# Patient Record
Sex: Female | Born: 1950 | Race: Black or African American | Hispanic: No | Marital: Married | Smoking: Never smoker
Health system: Southern US, Community
[De-identification: ages and names within clinical notes are randomized; demographics above are authoritative.]

## PROBLEM LIST (undated history)

## (undated) DIAGNOSIS — M1990 Unspecified osteoarthritis, unspecified site: Secondary | ICD-10-CM

## (undated) DIAGNOSIS — J189 Pneumonia, unspecified organism: Secondary | ICD-10-CM

## (undated) DIAGNOSIS — C801 Malignant (primary) neoplasm, unspecified: Secondary | ICD-10-CM

## (undated) DIAGNOSIS — I10 Essential (primary) hypertension: Secondary | ICD-10-CM

## (undated) DIAGNOSIS — K219 Gastro-esophageal reflux disease without esophagitis: Secondary | ICD-10-CM

## (undated) DIAGNOSIS — E7800 Pure hypercholesterolemia, unspecified: Secondary | ICD-10-CM

## (undated) DIAGNOSIS — F32A Depression, unspecified: Secondary | ICD-10-CM

## (undated) DIAGNOSIS — R51 Headache: Secondary | ICD-10-CM

## (undated) DIAGNOSIS — M069 Rheumatoid arthritis, unspecified: Secondary | ICD-10-CM

## (undated) DIAGNOSIS — Z87442 Personal history of urinary calculi: Secondary | ICD-10-CM

## (undated) DIAGNOSIS — F329 Major depressive disorder, single episode, unspecified: Secondary | ICD-10-CM

## (undated) HISTORY — PX: KIDNEY STONE SURGERY: SHX686

## (undated) HISTORY — PX: CATARACT EXTRACTION: SUR2

## (undated) HISTORY — PX: ESOPHAGEAL DILATION: SHX303

---

## 2012-08-18 DIAGNOSIS — J189 Pneumonia, unspecified organism: Secondary | ICD-10-CM

## 2012-08-18 HISTORY — DX: Pneumonia, unspecified organism: J18.9

## 2012-12-07 ENCOUNTER — Ambulatory Visit (HOSPITAL_COMMUNITY)
Admission: RE | Admit: 2012-12-07 | Discharge: 2012-12-07 | Disposition: A | Discharge status: Home / Self Care | Payer: Medicare Other | Source: Ambulatory Visit | Attending: Urology | Admitting: Urology

## 2012-12-07 ENCOUNTER — Other Ambulatory Visit (HOSPITAL_COMMUNITY): Payer: Self-pay | Admitting: Urology

## 2012-12-07 DIAGNOSIS — D4959 Neoplasm of unspecified behavior of other genitourinary organ: Secondary | ICD-10-CM

## 2012-12-07 DIAGNOSIS — R918 Other nonspecific abnormal finding of lung field: Secondary | ICD-10-CM | POA: Insufficient documentation

## 2012-12-07 DIAGNOSIS — Z01818 Encounter for other preprocedural examination: Secondary | ICD-10-CM | POA: Insufficient documentation

## 2012-12-08 ENCOUNTER — Other Ambulatory Visit: Payer: Self-pay | Admitting: Urology

## 2012-12-08 NOTE — Progress Notes (Signed)
PT HAD CXR DONE 12/07/12 - REPORT IN EPIC - ABNORMAL.  CXR REPORT FAXED TO DR. Vevelyn Royals OFFICE FOR REVIEW.

## 2012-12-11 ENCOUNTER — Encounter (INDEPENDENT_AMBULATORY_CARE_PROVIDER_SITE_OTHER): Payer: Self-pay

## 2012-12-11 ENCOUNTER — Encounter (HOSPITAL_COMMUNITY)
Admission: RE | Admit: 2012-12-11 | Discharge: 2012-12-11 | Disposition: A | Discharge status: Home / Self Care | Payer: Medicare Other | Source: Ambulatory Visit | Attending: Urology | Admitting: Urology

## 2012-12-11 ENCOUNTER — Other Ambulatory Visit: Payer: Self-pay

## 2012-12-11 ENCOUNTER — Encounter (HOSPITAL_COMMUNITY): Payer: Self-pay

## 2012-12-11 ENCOUNTER — Encounter (HOSPITAL_COMMUNITY): Payer: Self-pay | Admitting: Pharmacy Technician

## 2012-12-11 DIAGNOSIS — Z01812 Encounter for preprocedural laboratory examination: Secondary | ICD-10-CM | POA: Insufficient documentation

## 2012-12-11 DIAGNOSIS — Z01810 Encounter for preprocedural cardiovascular examination: Secondary | ICD-10-CM | POA: Insufficient documentation

## 2012-12-11 DIAGNOSIS — Z01818 Encounter for other preprocedural examination: Secondary | ICD-10-CM | POA: Insufficient documentation

## 2012-12-11 HISTORY — DX: Rheumatoid arthritis, unspecified: M06.9

## 2012-12-11 HISTORY — DX: Pneumonia, unspecified organism: J18.9

## 2012-12-11 HISTORY — DX: Personal history of urinary calculi: Z87.442

## 2012-12-11 HISTORY — DX: Headache: R51

## 2012-12-11 HISTORY — DX: Gastro-esophageal reflux disease without esophagitis: K21.9

## 2012-12-11 HISTORY — DX: Unspecified osteoarthritis, unspecified site: M19.90

## 2012-12-11 HISTORY — DX: Depression, unspecified: F32.A

## 2012-12-11 HISTORY — DX: Major depressive disorder, single episode, unspecified: F32.9

## 2012-12-11 HISTORY — DX: Pure hypercholesterolemia, unspecified: E78.00

## 2012-12-11 HISTORY — DX: Essential (primary) hypertension: I10

## 2012-12-11 LAB — BASIC METABOLIC PANEL
Calcium: 9.9 mg/dL (ref 8.4–10.5)
Creatinine, Ser: 0.58 mg/dL (ref 0.50–1.10)
GFR calc non Af Amer: >90 mL/min (ref >90)
Glucose, Bld: 90 mg/dL (ref 70–99)
Sodium: 141 mEq/L (ref 135–145)

## 2012-12-11 LAB — CBC
MCH: 26.5 pg (ref 26.0–34.0)
MCHC: 31.5 g/dL (ref 30.0–36.0)
MCV: 83.9 fL (ref 78.0–100.0)
Platelets: 352 10*3/uL (ref 150–400)
RBC: 4.23 MIL/uL (ref 3.87–5.11)
RDW: 15 % (ref 11.5–15.5)
WBC: 10 10*3/uL (ref 4.0–10.5)

## 2012-12-11 LAB — ABO/RH: ABO/RH(D): O POS

## 2012-12-11 NOTE — Patient Instructions (Addendum)
20 Emily Cooper  12/11/2012   Your procedure is scheduled on: 12/18/12  Report to [facility] at 5:15 AM.  Call this number if you have problems the morning of surgery [phone]   Remember:   Do not eat food or drink liquids After Midnight.     Take these medicines the morning of surgery with A SIP OF WATER: albuterol, nexium, hydrocodone if needed, zoloft, pravastatin   Do not wear jewelry, make-up or nail polish.  Do not wear lotions, powders, or perfumes. You may wear deodorant.  Do not shave 48 hours prior to surgery. Men may shave face and neck.  Do not bring valuables to the hospital.  Contacts, dentures or bridgework may not be worn into surgery.  Leave suitcase in the car. After surgery it may be brought to your room.  For patients admitted to the hospital, checkout time is 11:00 AM the day of discharge.   Please read over the following fact sheets that you were given: blood fact sheet, incentive spirometry fact sheet Birdie Sons, RN  pre op nurse call if needed [phone]    FAILURE TO FOLLOW THESE INSTRUCTIONS MAY RESULT IN CANCELLATION OF YOUR SURGERY   Patient Signature: ___________________________________________

## 2012-12-12 NOTE — H&P (Signed)
Chief Complaint Left renal mass and renal stone   History of Present Illness Emily Cooper is a 62 year old female seen in consultation at the request of Dr. Wendall Stade for a left complex renal mass. She has a history of kidney stones s/p ESWL in the past. She presented in late October with left flank pain and hematuria and was found to have a 1 cm lower pole left renal stone which was thought to probably be causing intermittent obstruction. On her non-contrasted CT scan, she was noted to have a possible complex cystic mass of the lower pole of the left kidney. She underwent cystoscopy and stent placement in preparation for ESWL therapy. On further evaluation with ultrasound, this complex mass appeared to be worrisome and MR imaging was recommended. She did undergo an MRI of the abdomen with and without contrast on 11/29/12 which showed a 4.2 cm complex cystic mass off the anterior lower pole of the left kidney. This demonstrated multiple thin septae which appeared to be enhancing raising concern for a possible cystic renal cell carcinoma. 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 There was no evidence of regional lymphadenopathy, contralateral renal masses except for simple appearing cysts, and no evidence of other abdominal metastases. There was a single branching renal artery to the left kidney and a circumaortic renal vein. Her baseline renal function is normal. Serum Cr was 0.65.    She has not yet been treated for her stone pending further evaluation of her renal mass. She does continue to have some intermittent hematuria since her stent was placed and does have some frequency and urgency symptoms. She will occasionally have some flank pain although this is fairly minimal. She has no family history of kidney cancer.   Past Medical History Problems  1. History of depression (V11.8) 2. History of gastroesophageal reflux (GERD) (V12.79) 3. History of hyperlipidemia (V12.29) 4. History  of hypertension (V12.59) 5. History of rheumatoid arthritis (V13.4)  Surgical History Problems  1. History of Cataract Surgery  Current Meds 1. Ciprofloxacin HCl - 500 MG Oral Tablet;  Therapy: (Recorded:20Nov2014) to Recorded 2. Exforge 10-320 MG Oral Tablet (Amlodipine Besylate-Valsartan);  Therapy: (Recorded:20Nov2014) to Recorded 3. Fluticasone Propionate 50 MCG/ACT Nasal Suspension;  Therapy: (Recorded:20Nov2014) to Recorded 4. Folic Acid CAPS;  Therapy: (Recorded:20Nov2014) to Recorded 5. Hydrocodone-Acetaminophen 10-325 MG Oral Tablet;  Therapy: (Recorded:20Nov2014) to Recorded 6. Methotrexate 2.5 MG Oral Tablet;  Therapy: (Recorded:20Nov2014) to Recorded 7. NexIUM 40 MG Oral Capsule Delayed Release;  Therapy: (Recorded:20Nov2014) to Recorded 8. Pravastatin Sodium 80 MG Oral Tablet;  Therapy: (Recorded:20Nov2014) to Recorded 9. PredniSONE (Pak) 10 MG Oral Tablet;  Therapy: (Recorded:20Nov2014) to Recorded 10. Sertraline HCl - 100 MG Oral Tablet;   Therapy: (Recorded:20Nov2014) to Recorded 11. Tamsulosin HCl - 0.4 MG Oral Capsule;   Therapy: (Recorded:20Nov2014) to Recorded 12. Ventolin HFA 108 (90 Base) MCG/ACT Inhalation Aerosol Solution;   Therapy: (Recorded:20Nov2014) to Recorded  Allergies Medication  1. Naproxen TABS  Family History Problems  1. Family history of cardiac disorder (V17.49) : Father  Social History Problems    Denied: History of Alcohol use   Denied: History of Caffeine use   Former smoker Chemical engineer)   quit smoking 2004   Married  Review of Systems Genitourinary, constitutional, skin, eye, otolaryngeal, hematologic/lymphatic, cardiovascular, pulmonary, endocrine, musculoskeletal, gastrointestinal, neurological and psychiatric system(s) were reviewed and pertinent findings if present are noted.  Gastrointestinal: heartburn.  Eyes: blurred vision.  ENT: sinus problems.  Cardiovascular: no chest pain and no  leg swelling.  Endocrine:  polydipsia.  Musculoskeletal: back pain and joint pain.  Neurological: headache.  Psychiatric: depression and anxiety.    Vitals Vital Signs [Data Includes: Last 1 Day]  Recorded: 20Nov2014 11:18AM  Height: 5 ft 2 in Weight: 199 lb  BMI Calculated: 36.4 BSA Calculated: 1.91 Blood Pressure: 155 / 96 Temperature: 98.8 F Heart Rate: 103  Physical Exam Constitutional: Well nourished and well developed . No acute distress.  ENT:. The ears and nose are normal in appearance.  Neck: The appearance of the neck is normal and no neck mass is present.  Pulmonary: No respiratory distress, normal respiratory rhythm and effort and clear bilateral breath sounds.  Cardiovascular: Heart rate and rhythm are normal . No peripheral edema.  Abdomen: The abdomen is mildly obese. The abdomen is soft and nontender. No masses are palpated. No CVA tenderness. No hernias are palpable. No hepatosplenomegaly noted.  Lymphatics: The femoral and inguinal nodes are not enlarged or tender.  Skin: Normal skin turgor, no visible rash and no visible skin lesions.  Neuro/Psych:. Mood and affect are appropriate.    Results/Data Urine [Data Includes: Last 1 Day]   20Nov2014  COLOR RED   APPEARANCE TURBID   SPECIFIC GRAVITY 1.025   pH 8.0   GLUCOSE NEG mg/dL  BILIRUBIN NEG   KETONE NEG mg/dL  BLOOD LARGE   PROTEIN 100 mg/dL  UROBILINOGEN 0.2 mg/dL  NITRITE POS   LEUKOCYTE ESTERASE TRACE   SQUAMOUS EPITHELIAL/HPF RARE   WBC 0-2 WBC/hpf  RBC TNTC RBC/hpf  BACTERIA NONE SEEN   CRYSTALS NONE SEEN   CASTS NONE SEEN    I have independently reviewed her recent CT scan, renal ultrasound, and MRI. Findings are as dictated above.   Assessment Assessed  1. Nephrolithiasis (592.0) 2. 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 UA With REFLEX; Status:Complete;   Done: 20Nov2014 10:57AM Renal neoplasm  2. Follow-up Schedule Surgery Office  Follow-up  Status: Complete  Done: 20Nov2014 3. CHEST X-RAY;  Status:Hold For - Print,Records; Requested for:20Nov2014;  4. COMPREHENSIVE METABOLIC PANEL; Status:In Progress - Specimen/Data Collected;    Done: 20Nov2014 5. VENIPUNCTURE; Status:Complete;   Done: 20Nov2014  Discussion/Summary  1. Left complex renal cyst: I reviewed her MRI with her and her husband today. This does demonstrate a Bosniak 3 complex renal cyst. We discussed that this does carry a 50% chance of being a malignant lesion. We therefore discussed options for management. I did recommend she strongly consider surgical excision for a definitive diagnosis and for potentially curative treatment if this is found to be a malignant lesion. Considering the location of the mass, I have recommended that she consider a left robotic-assisted laparoscopic partial nephrectomy although she would be a potential risk for a total nephrectomy considering the proximity to the renal hilum. She will complete her metastatic evaluation including chest imaging and laboratory studies today.  She will require stress dose steroids perioperatively.1    The patient was provided information regarding their renal mass including the relative risk of benign versus malignant pathology and the natural history of renal cell carcinoma and other possible malignancies of the kidney. The role of renal biopsy, laboratory testing, and imaging studies to further characterize renal masses and/or the presence of metastatic disease were explained. We discussed the role of active surveillance, surgical therapy with both radical nephrectomy and nephron-sparing surgery, and ablative therapy in the treatment of renal masses. In addition, we discussed our goals of providing an accurate diagnosis and oncologic control  while maintaining optimal renal function as appropriate based on the size, location, and complexity of their renal mass as well as their co-morbidities.    We have discussed the risks of treatment in detail including but not limited  to bleeding, infection, heart attack, stroke, death, venothromoboembolism, cancer recurrence, injury/damage to surrounding organs and structures, urine leak, the possibility of open surgical conversion for patients undergoing minimally invasive surgery, the risk of developing chronic kidney disease and its associated implications, and the potential risk of end stage renal disease possibly necessitating dialysis.     2. Left renal calculus: She has a 1 cm stone located in the lower pole of the left kidney. It is possible that we may be able to perform a pyelolithotomy at the time of her upcoming partial nephrectomy. She understands that I will not proceed with this if it would potentially dissect her partial nephrectomy surgery or her recovery from surgery. She does have an indwelling stent and this will be left in place.    Cc: Dr. Louellen Molder  Dr. Phineas Semen     1 Amended By: Heloise Purpura; Dec 08 2012 5:02 PM EST  Verified Results COMPREHENSIVE METABOLIC PANEL1 20Nov2014 12:19PM1 Emily Cooper  SPECIMEN TYPE: BLOOD   Test Name Result Flag Reference  GLUCOSE1 94 mg/dL1  70-991  BUN1 12 mg/dL1  6-231  CREATININE1 0.57 mg/dL1  0.50-1.401  SODIUM1 144 mEq/L1  [phone]  POTASSIUM1 3.7 mEq/L1  3.5-5.31  CHLORIDE1 107 mEq/L1  96-1121  CO21 30 mEq/L1  19-321  CALCIUM1 9.9 mg/dL1  8.4-10.51  TOTAL PROTEIN1 6.7 g/dL1  6.0-8.31  ALBUMIN1 4.3 g/dL1  3.5-5.21  AST/SGOT1 12 U/L1  0-371  ALT/SGPT1 16 U/L1  0-351  ALKALINE PHOSPHATASE1 69 U/L1  39-1171  BILIRUBIN, TOTAL1 0.5 mg/dL1  0.3-1.21  Est GFR, African American1 >89 mL/min1    Est GFR, NonAfrican American1 >89 mL/min1    THE ESTIMATED GFR IS A CALCULATION VALID FOR ADULTS (>=73 YEARS OLD) THAT USES THE CKD-EPI ALGORITHM TO ADJUST FOR AGE AND SEX. IT IS   NOT TO BE USED FOR CHILDREN, PREGNANT WOMEN, HOSPITALIZED PATIENTS,    PATIENTS ON DIALYSIS, OR WITH RAPIDLY CHANGING KIDNEY FUNCTION. ACCORDING TO THE NKDEP, EGFR >89 IS NORMAL, 60-89  SHOWS MILD IMPAIRMENT, 30-59 SHOWS MODERATE IMPAIRMENT, 15-29 SHOWS SEVERE IMPAIRMENT AND <15 IS ESRD.     1. Amended By: Heloise Purpura; Dec 07 2012 9:10 PM EST  SignaturesElectronically signed by : Heloise Purpura, M.D.; Dec 08 2012  5:03PM EST

## 2012-12-13 NOTE — Anesthesia Preprocedure Evaluation (Addendum)
Anesthesia Evaluation  Patient identified by MRN, date of birth, ID band Patient awake    Reviewed: Allergy & Precautions, H&P , NPO status , Patient's Chart, lab work & pertinent test results  Airway Mallampati: II TM Distance: <3 FB Neck ROM: Full    Dental no notable dental hx.    Pulmonary neg pulmonary ROS,  breath sounds clear to auscultation  Pulmonary exam normal       Cardiovascular hypertension, Pt. on medications Rhythm:Regular Rate:Normal     Neuro/Psych negative neurological ROS  negative psych ROS   GI/Hepatic negative GI ROS, Neg liver ROS,   Endo/Other  negative endocrine ROS  Renal/GU negative Renal ROS  negative genitourinary   Musculoskeletal  (+) Arthritis -, Rheumatoid disorders,    Abdominal   Peds negative pediatric ROS (+)  Hematology negative hematology ROS (+)   Anesthesia Other Findings   Reproductive/Obstetrics negative OB ROS                         Anesthesia Physical Anesthesia Plan  ASA: II  Anesthesia Plan: General   Post-op Pain Management:    Induction: Intravenous  Airway Management Planned: Oral ETT  Additional Equipment:   Intra-op Plan:   Post-operative Plan: Extubation in OR  Informed Consent: I have reviewed the patients History and Physical, chart, labs and discussed the procedure including the risks, benefits and alternatives for the proposed anesthesia with the patient or authorized representative who has indicated his/her understanding and acceptance.   Dental advisory given  Plan Discussed with: CRNA and Surgeon  Anesthesia Plan Comments:         Anesthesia Quick Evaluation

## 2012-12-18 ENCOUNTER — Inpatient Hospital Stay (HOSPITAL_COMMUNITY): Payer: Medicare Other | Admitting: Anesthesiology

## 2012-12-18 ENCOUNTER — Inpatient Hospital Stay (HOSPITAL_COMMUNITY)
Admission: RE | Admit: 2012-12-18 | Discharge: 2012-12-21 | DRG: 658 | Disposition: A | Discharge status: Home Health | Payer: Medicare Other | Source: Ambulatory Visit | Attending: Urology | Admitting: Urology

## 2012-12-18 ENCOUNTER — Encounter (HOSPITAL_COMMUNITY): Payer: Medicare Other | Admitting: Anesthesiology

## 2012-12-18 ENCOUNTER — Encounter (HOSPITAL_COMMUNITY): Payer: Self-pay | Admitting: *Deleted

## 2012-12-18 ENCOUNTER — Encounter (HOSPITAL_COMMUNITY)
Admission: RE | Disposition: A | Discharge status: Home Health | Payer: Self-pay | Source: Ambulatory Visit | Attending: Urology

## 2012-12-18 DIAGNOSIS — M069 Rheumatoid arthritis, unspecified: Secondary | ICD-10-CM | POA: Diagnosis present

## 2012-12-18 DIAGNOSIS — Z8249 Family history of ischemic heart disease and other diseases of the circulatory system: Secondary | ICD-10-CM

## 2012-12-18 DIAGNOSIS — K219 Gastro-esophageal reflux disease without esophagitis: Secondary | ICD-10-CM | POA: Diagnosis present

## 2012-12-18 DIAGNOSIS — R509 Fever, unspecified: Secondary | ICD-10-CM | POA: Diagnosis not present

## 2012-12-18 DIAGNOSIS — Z23 Encounter for immunization: Secondary | ICD-10-CM

## 2012-12-18 DIAGNOSIS — I10 Essential (primary) hypertension: Secondary | ICD-10-CM | POA: Diagnosis present

## 2012-12-18 DIAGNOSIS — F3289 Other specified depressive episodes: Secondary | ICD-10-CM | POA: Diagnosis present

## 2012-12-18 DIAGNOSIS — F329 Major depressive disorder, single episode, unspecified: Secondary | ICD-10-CM | POA: Diagnosis present

## 2012-12-18 DIAGNOSIS — R319 Hematuria, unspecified: Secondary | ICD-10-CM | POA: Diagnosis present

## 2012-12-18 DIAGNOSIS — Z87891 Personal history of nicotine dependence: Secondary | ICD-10-CM

## 2012-12-18 DIAGNOSIS — C649 Malignant neoplasm of unspecified kidney, except renal pelvis: Principal | ICD-10-CM | POA: Diagnosis present

## 2012-12-18 DIAGNOSIS — E785 Hyperlipidemia, unspecified: Secondary | ICD-10-CM | POA: Diagnosis present

## 2012-12-18 DIAGNOSIS — N200 Calculus of kidney: Secondary | ICD-10-CM | POA: Diagnosis present

## 2012-12-18 DIAGNOSIS — Z79899 Other long term (current) drug therapy: Secondary | ICD-10-CM

## 2012-12-18 DIAGNOSIS — D49519 Neoplasm of unspecified behavior of unspecified kidney: Secondary | ICD-10-CM | POA: Diagnosis present

## 2012-12-18 HISTORY — PX: ROBOTIC ASSITED PARTIAL NEPHRECTOMY: SHX6087

## 2012-12-18 LAB — BASIC METABOLIC PANEL
BUN: 13 mg/dL (ref 6–23)
Calcium: 9.3 mg/dL (ref 8.4–10.5)
Chloride: 100 mEq/L (ref 96–112)
GFR calc Af Amer: 90 mL/min — ABNORMAL LOW (ref >90)
Glucose, Bld: 141 mg/dL — ABNORMAL HIGH (ref 70–99)
Potassium: 3.8 mEq/L (ref 3.5–5.1)
Sodium: 136 mEq/L (ref 135–145)

## 2012-12-18 LAB — TYPE AND SCREEN: ABO/RH(D): O POS

## 2012-12-18 SURGERY — ROBOTIC ASSITED PARTIAL NEPHRECTOMY
Anesthesia: General | Laterality: Left | Wound class: Clean Contaminated

## 2012-12-18 MED ORDER — NEOSTIGMINE METHYLSULFATE 1 MG/ML IJ SOLN
INTRAMUSCULAR | Status: AC
  Filled 2012-12-18: qty 10

## 2012-12-18 MED ORDER — DEXTROSE-NACL 5-0.45 % IV SOLN
INTRAVENOUS | Status: DC
  Administered 2012-12-18 – 2012-12-19 (×4): via INTRAVENOUS

## 2012-12-18 MED ORDER — HYDROCORTISONE SOD SUCCINATE 100 MG IJ SOLR
50.0000 mg | Freq: Once | INTRAMUSCULAR | Status: AC
  Administered 2012-12-19: 50 mg via INTRAVENOUS
  Filled 2012-12-18: qty 1

## 2012-12-18 MED ORDER — INFLUENZA VAC SPLIT QUAD 0.5 ML IM SUSP
0.5000 mL | INTRAMUSCULAR | Status: AC
  Administered 2012-12-19: 10:00:00 0.5 mL via INTRAMUSCULAR
  Filled 2012-12-18 (×2): qty 0.5

## 2012-12-18 MED ORDER — METOCLOPRAMIDE HCL 5 MG/ML IJ SOLN
INTRAMUSCULAR | Status: DC | PRN
  Administered 2012-12-18: 10 mg via INTRAVENOUS

## 2012-12-18 MED ORDER — ONDANSETRON HCL 4 MG/2ML IJ SOLN: 4.0000 mg | INTRAMUSCULAR | Status: DC | PRN

## 2012-12-18 MED ORDER — SODIUM CHLORIDE 0.9 % IJ SOLN
INTRAMUSCULAR | Status: DC | PRN
  Administered 2012-12-18: 20 mL

## 2012-12-18 MED ORDER — GLYCOPYRROLATE 0.2 MG/ML IJ SOLN
INTRAMUSCULAR | Status: AC
  Filled 2012-12-18: qty 3

## 2012-12-18 MED ORDER — MIDAZOLAM HCL 2 MG/2ML IJ SOLN
INTRAMUSCULAR | Status: AC
  Filled 2012-12-18: qty 2

## 2012-12-18 MED ORDER — SERTRALINE HCL 100 MG PO TABS
100.0000 mg | ORAL_TABLET | Freq: Every day | ORAL | Status: DC
  Administered 2012-12-18 – 2012-12-21 (×4): 100 mg via ORAL
  Filled 2012-12-18 (×4): qty 1

## 2012-12-18 MED ORDER — HEMOSTATIC AGENTS (NO CHARGE) OPTIME
TOPICAL | Status: DC | PRN
  Administered 2012-12-18: 1 via TOPICAL

## 2012-12-18 MED ORDER — LIDOCAINE 5 % EX PTCH
1.0000 | MEDICATED_PATCH | CUTANEOUS | Status: DC
  Administered 2012-12-19 – 2012-12-21 (×3): 1 via TRANSDERMAL
  Filled 2012-12-18 (×4): qty 1

## 2012-12-18 MED ORDER — ONDANSETRON HCL 4 MG/2ML IJ SOLN
INTRAMUSCULAR | Status: DC | PRN
  Administered 2012-12-18: 4 mg via INTRAVENOUS

## 2012-12-18 MED ORDER — HYDROCORTISONE SOD SUCCINATE 100 MG IJ SOLR
INTRAMUSCULAR | Status: DC | PRN
  Administered 2012-12-18: 100 mg via INTRAVENOUS

## 2012-12-18 MED ORDER — MORPHINE SULFATE 2 MG/ML IJ SOLN
2.0000 mg | INTRAMUSCULAR | Status: DC | PRN
  Administered 2012-12-18: 4 mg via INTRAVENOUS
  Administered 2012-12-18 – 2012-12-19 (×2): 2 mg via INTRAVENOUS
  Filled 2012-12-18: qty 1
  Filled 2012-12-18: qty 2
  Filled 2012-12-18: qty 1

## 2012-12-18 MED ORDER — ACETAMINOPHEN 10 MG/ML IV SOLN
1000.0000 mg | Freq: Four times a day (QID) | INTRAVENOUS | Status: AC
  Administered 2012-12-18 – 2012-12-19 (×4): 1000 mg via INTRAVENOUS
  Filled 2012-12-18 (×4): qty 100

## 2012-12-18 MED ORDER — LIDOCAINE HCL (CARDIAC) 20 MG/ML IV SOLN
INTRAVENOUS | Status: AC
  Filled 2012-12-18: qty 5

## 2012-12-18 MED ORDER — CEFAZOLIN SODIUM 1-5 GM-% IV SOLN
1.0000 g | Freq: Three times a day (TID) | INTRAVENOUS | Status: AC
Start: 2012-12-18 — End: 2012-12-18
  Administered 2012-12-18 (×2): 1 g via INTRAVENOUS
  Filled 2012-12-18 (×3): qty 50

## 2012-12-18 MED ORDER — AMLODIPINE BESYLATE-VALSARTAN 10-320 MG PO TABS: 1.0000 | ORAL_TABLET | Freq: Every day | ORAL | Status: DC

## 2012-12-18 MED ORDER — CEFAZOLIN SODIUM-DEXTROSE 2-3 GM-% IV SOLR
2.0000 g | INTRAVENOUS | Status: AC
  Administered 2012-12-18: 2 g via INTRAVENOUS

## 2012-12-18 MED ORDER — GLYCOPYRROLATE 0.2 MG/ML IJ SOLN
INTRAMUSCULAR | Status: DC | PRN
  Administered 2012-12-18: 0.6 mg via INTRAVENOUS

## 2012-12-18 MED ORDER — SUFENTANIL CITRATE 50 MCG/ML IV SOLN
INTRAVENOUS | Status: AC
  Filled 2012-12-18: qty 1

## 2012-12-18 MED ORDER — CISATRACURIUM BESYLATE (PF) 10 MG/5ML IV SOLN
INTRAVENOUS | Status: DC | PRN
  Administered 2012-12-18: 3 mg via INTRAVENOUS
  Administered 2012-12-18: 4 mg via INTRAVENOUS
  Administered 2012-12-18: 3 mg via INTRAVENOUS

## 2012-12-18 MED ORDER — PROPOFOL 10 MG/ML IV BOLUS
INTRAVENOUS | Status: AC
  Filled 2012-12-18: qty 20

## 2012-12-18 MED ORDER — AMLODIPINE BESYLATE 10 MG PO TABS
10.0000 mg | ORAL_TABLET | Freq: Every day | ORAL | Status: DC
  Administered 2012-12-18 – 2012-12-21 (×4): 10 mg via ORAL
  Filled 2012-12-18 (×4): qty 1

## 2012-12-18 MED ORDER — LIDOCAINE HCL (CARDIAC) 20 MG/ML IV SOLN
INTRAVENOUS | Status: DC | PRN
  Administered 2012-12-18: 60 mg via INTRAVENOUS

## 2012-12-18 MED ORDER — ONDANSETRON HCL 4 MG/2ML IJ SOLN
INTRAMUSCULAR | Status: AC
  Filled 2012-12-18: qty 2

## 2012-12-18 MED ORDER — NEOSTIGMINE METHYLSULFATE 1 MG/ML IJ SOLN
INTRAMUSCULAR | Status: DC | PRN
  Administered 2012-12-18: 4 mg via INTRAVENOUS

## 2012-12-18 MED ORDER — CISATRACURIUM BESYLATE 20 MG/10ML IV SOLN
INTRAVENOUS | Status: AC
  Filled 2012-12-18: qty 10

## 2012-12-18 MED ORDER — STERILE WATER FOR IRRIGATION IR SOLN
Status: DC | PRN
  Administered 2012-12-18: 3000 mL

## 2012-12-18 MED ORDER — LACTATED RINGERS IR SOLN
Status: DC | PRN
  Administered 2012-12-18: 1000 mL

## 2012-12-18 MED ORDER — PROPOFOL 10 MG/ML IV BOLUS
INTRAVENOUS | Status: DC | PRN
  Administered 2012-12-18: 170 mg via INTRAVENOUS

## 2012-12-18 MED ORDER — SUFENTANIL CITRATE 50 MCG/ML IV SOLN
INTRAVENOUS | Status: DC | PRN
  Administered 2012-12-18: 10 ug via INTRAVENOUS
  Administered 2012-12-18 (×3): 5 ug via INTRAVENOUS
  Administered 2012-12-18: 10 ug via INTRAVENOUS
  Administered 2012-12-18 (×5): 5 ug via INTRAVENOUS
  Administered 2012-12-18 (×2): 10 ug via INTRAVENOUS

## 2012-12-18 MED ORDER — ROCURONIUM BROMIDE 100 MG/10ML IV SOLN
INTRAVENOUS | Status: DC | PRN
  Administered 2012-12-18: 50 mg via INTRAVENOUS

## 2012-12-18 MED ORDER — CEFAZOLIN SODIUM-DEXTROSE 2-3 GM-% IV SOLR
INTRAVENOUS | Status: AC
  Filled 2012-12-18: qty 50

## 2012-12-18 MED ORDER — IRBESARTAN 300 MG PO TABS
300.0000 mg | ORAL_TABLET | Freq: Every day | ORAL | Status: DC
  Administered 2012-12-18 – 2012-12-21 (×4): 300 mg via ORAL
  Filled 2012-12-18 (×4): qty 1

## 2012-12-18 MED ORDER — MANNITOL 25 % IV SOLN
25.0000 g | Freq: Once | INTRAVENOUS | Status: AC
  Administered 2012-12-18 (×2): 12.5 g via INTRAVENOUS
  Filled 2012-12-18: qty 100

## 2012-12-18 MED ORDER — HYDROMORPHONE HCL PF 1 MG/ML IJ SOLN
INTRAMUSCULAR | Status: DC | PRN
  Administered 2012-12-18 (×4): 0.5 mg via INTRAVENOUS

## 2012-12-18 MED ORDER — SODIUM CHLORIDE 0.9 % IJ SOLN
INTRAMUSCULAR | Status: AC
  Filled 2012-12-18: qty 10

## 2012-12-18 MED ORDER — DIPHENHYDRAMINE HCL 50 MG/ML IJ SOLN: 12.5000 mg | Freq: Four times a day (QID) | INTRAMUSCULAR | Status: DC | PRN

## 2012-12-18 MED ORDER — LACTATED RINGERS IV SOLN
INTRAVENOUS | Status: DC | PRN
  Administered 2012-12-18 (×4): via INTRAVENOUS

## 2012-12-18 MED ORDER — ALBUTEROL SULFATE HFA 108 (90 BASE) MCG/ACT IN AERS
INHALATION_SPRAY | RESPIRATORY_TRACT | Status: AC
  Filled 2012-12-18: qty 6.7

## 2012-12-18 MED ORDER — PANTOPRAZOLE SODIUM 40 MG PO TBEC
40.0000 mg | DELAYED_RELEASE_TABLET | Freq: Every day | ORAL | Status: DC
  Administered 2012-12-18 – 2012-12-21 (×4): 40 mg via ORAL
  Filled 2012-12-18 (×3): qty 1

## 2012-12-18 MED ORDER — ESMOLOL HCL 10 MG/ML IV SOLN
INTRAVENOUS | Status: DC | PRN
  Administered 2012-12-18: 30 mg via INTRAVENOUS

## 2012-12-18 MED ORDER — DIPHENHYDRAMINE HCL 12.5 MG/5ML PO ELIX: 12.5000 mg | ORAL_SOLUTION | Freq: Four times a day (QID) | ORAL | Status: DC | PRN

## 2012-12-18 MED ORDER — HYDROCORTISONE SOD SUCCINATE 100 MG IJ SOLR
25.0000 mg | Freq: Once | INTRAMUSCULAR | Status: AC
  Administered 2012-12-20: 14:00:00 via INTRAVENOUS
  Filled 2012-12-18 (×2): qty 0.5

## 2012-12-18 MED ORDER — SODIUM CHLORIDE 0.9 % IJ SOLN
INTRAMUSCULAR | Status: AC
  Filled 2012-12-18: qty 20

## 2012-12-18 MED ORDER — DOCUSATE SODIUM 100 MG PO CAPS
100.0000 mg | ORAL_CAPSULE | Freq: Two times a day (BID) | ORAL | Status: DC
  Administered 2012-12-18 – 2012-12-21 (×7): 100 mg via ORAL
  Filled 2012-12-18 (×9): qty 1

## 2012-12-18 MED ORDER — PROMETHAZINE HCL 25 MG/ML IJ SOLN: 6.2500 mg | INTRAMUSCULAR | Status: DC | PRN

## 2012-12-18 MED ORDER — HYDROCORTISONE SOD SUCCINATE 100 MG IJ SOLR
25.0000 mg | Freq: Once | INTRAMUSCULAR | Status: AC
  Administered 2012-12-19: 25 mg via INTRAVENOUS
  Filled 2012-12-18: qty 0.5

## 2012-12-18 MED ORDER — MIDAZOLAM HCL 5 MG/5ML IJ SOLN
INTRAMUSCULAR | Status: DC | PRN
  Administered 2012-12-18: 2 mg via INTRAVENOUS

## 2012-12-18 MED ORDER — PRAVASTATIN SODIUM 40 MG PO TABS
80.0000 mg | ORAL_TABLET | Freq: Every day | ORAL | Status: DC
  Administered 2012-12-18 – 2012-12-20 (×3): 80 mg via ORAL
  Filled 2012-12-18 (×4): qty 2

## 2012-12-18 MED ORDER — HYDROMORPHONE HCL PF 2 MG/ML IJ SOLN
INTRAMUSCULAR | Status: AC
  Filled 2012-12-18: qty 1

## 2012-12-18 MED ORDER — AMLODIPINE BESYLATE 10 MG PO TABS
10.0000 mg | ORAL_TABLET | Freq: Once | ORAL | Status: AC
  Administered 2012-12-18: 10 mg via ORAL
  Filled 2012-12-18: qty 1

## 2012-12-18 MED ORDER — HYDROMORPHONE HCL PF 1 MG/ML IJ SOLN
INTRAMUSCULAR | Status: AC
  Filled 2012-12-18: qty 1

## 2012-12-18 MED ORDER — HYDROCORTISONE SOD SUCCINATE 100 MG IJ SOLR
50.0000 mg | Freq: Once | INTRAMUSCULAR | Status: AC
  Administered 2012-12-18: 21:00:00 via INTRAVENOUS
  Filled 2012-12-18: qty 1

## 2012-12-18 MED ORDER — PHENYLEPHRINE HCL 10 MG/ML IJ SOLN
INTRAMUSCULAR | Status: AC
  Filled 2012-12-18: qty 1

## 2012-12-18 MED ORDER — BUPIVACAINE LIPOSOME 1.3 % IJ SUSP
20.0000 mL | Freq: Once | INTRAMUSCULAR | Status: AC
  Administered 2012-12-18: 20 mL
  Filled 2012-12-18: qty 20

## 2012-12-18 MED ORDER — ZOLPIDEM TARTRATE 5 MG PO TABS: 5.0000 mg | ORAL_TABLET | Freq: Every evening | ORAL | Status: DC | PRN

## 2012-12-18 MED ORDER — HYDROMORPHONE HCL PF 1 MG/ML IJ SOLN
0.2500 mg | INTRAMUSCULAR | Status: DC | PRN
  Administered 2012-12-18: 0.5 mg via INTRAVENOUS

## 2012-12-18 MED ORDER — ALBUTEROL SULFATE HFA 108 (90 BASE) MCG/ACT IN AERS: 1.0000 | INHALATION_SPRAY | Freq: Four times a day (QID) | RESPIRATORY_TRACT | Status: DC | PRN

## 2012-12-18 MED ORDER — SIMVASTATIN 40 MG PO TABS: 40.0000 mg | ORAL_TABLET | Freq: Every day | ORAL | Status: DC

## 2012-12-18 MED ORDER — HYDROCORTISONE SOD SUCCINATE 100 MG IJ SOLR
INTRAMUSCULAR | Status: AC
  Filled 2012-12-18: qty 2

## 2012-12-18 SURGICAL SUPPLY — 56 items
APPLICATOR SURGIFLO ENDO (HEMOSTASIS) ×2 IMPLANT
CHLORAPREP W/TINT 26ML (MISCELLANEOUS) ×2 IMPLANT
CLIP LIGATING HEM O LOK PURPLE (MISCELLANEOUS) ×2 IMPLANT
CLIP LIGATING HEMO O LOK GREEN (MISCELLANEOUS) ×4 IMPLANT
CORDS BIPOLAR (ELECTRODE) ×2 IMPLANT
COVER SURGICAL LIGHT HANDLE (MISCELLANEOUS) ×2 IMPLANT
COVER TIP SHEARS 8 DVNC (MISCELLANEOUS) ×1 IMPLANT
COVER TIP SHEARS 8MM DA VINCI (MISCELLANEOUS) ×1
DECANTER SPIKE VIAL GLASS SM (MISCELLANEOUS) ×2 IMPLANT
DERMABOND ADVANCED (GAUZE/BANDAGES/DRESSINGS) ×2
DERMABOND ADVANCED .7 DNX12 (GAUZE/BANDAGES/DRESSINGS) ×2 IMPLANT
DRAIN CHANNEL 15F RND FF 3/16 (WOUND CARE) ×2 IMPLANT
DRAPE INCISE IOBAN 66X45 STRL (DRAPES) ×2 IMPLANT
DRAPE LAPAROSCOPIC ABDOMINAL (DRAPES) ×2 IMPLANT
DRAPE LG THREE QUARTER DISP (DRAPES) ×4 IMPLANT
DRAPE TABLE BACK 44X90 PK DISP (DRAPES) ×2 IMPLANT
DRAPE WARM FLUID 44X44 (DRAPE) ×2 IMPLANT
DRESSING SURGICEL FIBRLLR 1X2 (HEMOSTASIS) IMPLANT
DRSG SURGICEL FIBRILLAR 1X2 (HEMOSTASIS)
ELECT REM PT RETURN 9FT ADLT (ELECTROSURGICAL) ×2
ELECTRODE REM PT RTRN 9FT ADLT (ELECTROSURGICAL) ×1 IMPLANT
EVACUATOR SILICONE 100CC (DRAIN) ×2 IMPLANT
GLOVE BIO SURGEON STRL SZ 6.5 (GLOVE) ×2 IMPLANT
GLOVE BIOGEL M STRL SZ7.5 (GLOVE) ×4 IMPLANT
GOWN PREVENTION PLUS LG XLONG (DISPOSABLE) IMPLANT
GOWN STRL NON-REIN LRG LVL3 (GOWN DISPOSABLE) ×4 IMPLANT
GOWN STRL REIN XL XLG (GOWN DISPOSABLE) ×4 IMPLANT
HEMOSTAT SURGICEL 2X3 (HEMOSTASIS) ×2 IMPLANT
HEMOSTAT SURGICEL 4X8 (HEMOSTASIS) IMPLANT
KIT ACCESSORY DA VINCI DISP (KITS) ×1
KIT ACCESSORY DVNC DISP (KITS) ×1 IMPLANT
KIT BASIN OR (CUSTOM PROCEDURE TRAY) ×2 IMPLANT
MANIFOLD NEPTUNE II (INSTRUMENTS) ×2 IMPLANT
PENCIL BUTTON HOLSTER BLD 10FT (ELECTRODE) ×2 IMPLANT
POSITIONER SURGICAL ARM (MISCELLANEOUS) ×4 IMPLANT
POUCH SPECIMEN RETRIEVAL 10MM (ENDOMECHANICALS) ×2 IMPLANT
SET TUBE IRRIG SUCTION NO TIP (IRRIGATION / IRRIGATOR) ×2 IMPLANT
SOLUTION ANTI FOG 6CC (MISCELLANEOUS) ×2 IMPLANT
SOLUTION ELECTROLUBE (MISCELLANEOUS) ×2 IMPLANT
SPONGE LAP 18X18 X RAY DECT (DISPOSABLE) IMPLANT
SURGIFLO W/THROMBIN 8M KIT (HEMOSTASIS) ×2 IMPLANT
SUT ETHILON 3 0 PS 1 (SUTURE) ×2 IMPLANT
SUT MNCRL AB 4-0 PS2 18 (SUTURE) ×4 IMPLANT
SUT V-LOC BARB 180 2/0GR6 GS22 (SUTURE) ×2
SUT VIC AB 0 CT1 27 (SUTURE) ×1
SUT VIC AB 0 CT1 27XBRD ANTBC (SUTURE) ×1 IMPLANT
SUT VICRYL 0 UR6 27IN ABS (SUTURE) ×4 IMPLANT
SUT VLOC BARB 180 ABS3/0GR12 (SUTURE) ×4
SUTURE V-LC BRB 180 2/0GR6GS22 (SUTURE) ×1 IMPLANT
SUTURE VLOC BRB 180 ABS3/0GR12 (SUTURE) ×2 IMPLANT
TOWEL OR NON WOVEN STRL DISP B (DISPOSABLE) ×2 IMPLANT
TRAY FOLEY CATH 16FRSI W/METER (SET/KITS/TRAYS/PACK) IMPLANT
TRAY LAP CHOLE (CUSTOM PROCEDURE TRAY) ×2 IMPLANT
TROCAR XCEL 12X100 BLDLESS (ENDOMECHANICALS) ×2 IMPLANT
TUBING INSUFFLATION 10FT LAP (TUBING) ×2 IMPLANT
WATER STERILE IRR 1500ML POUR (IV SOLUTION) ×4 IMPLANT

## 2012-12-18 NOTE — Anesthesia Postprocedure Evaluation (Signed)
  Anesthesia Post-op Note  Patient: Emily Cooper  Procedure(s) Performed: Procedure(s) (LRB): ROBOTIC ASSITED PARTIAL NEPHRECTOMY AND  LEFT PYELOLITHOTOMY (Left)  Patient Location: PACU  Anesthesia Type: General  Level of Consciousness: awake and alert   Airway and Oxygen Therapy: Patient Spontanous Breathing  Post-op Pain: mild  Post-op Assessment: Post-op Vital signs reviewed, Patient's Cardiovascular Status Stable, Respiratory Function Stable, Patent Airway and No signs of Nausea or vomiting  Last Vitals:  Filed Vitals:   12/18/12 1230  BP: 133/83  Pulse: 98  Temp:   Resp: 15    Post-op Vital Signs: stable   Complications: No apparent anesthesia complications

## 2012-12-18 NOTE — Op Note (Signed)
Preoperative diagnosis: Left renal neoplasm, left renal calculus  Postoperative diagnosis: Left renal neoplasm, left renal calculus  Procedure:  1. Left robotic-assisted laparoscopic partial nephrectomy 2.   Left robotic-assisted laparoscopic pyelolithotomy  Surgeon: Moody Bruins. M.D.  Assistant(s): Pecola Leisure, PA-C  Anesthesia: General  Complications: None  EBL: 125 mL  IVF:  3000 mL crystalloid  Specimens: 1. Left renal neoplasm 2.   Left renal calculus  Disposition of specimens: Pathology, [facility]  Intraoperative findings:       1. Warm renal ischemia time: 33 minutes  Drains: 1. # 15 Blake perinephric drain  Indication:  Emily Cooper is a 62 y.o. year old patient who presented with a left renal calculus and was incidentally noted to have a left renal neoplasm.  After a thorough review of the management options for their renal mass and renal stone, they elected to proceed with surgical treatment and the above procedure.  We have discussed the potential benefits and risks of the procedure, side effects of the proposed treatment, the likelihood of the patient achieving the goals of the procedure, and any potential problems that might occur during the procedure or recuperation. Informed consent has been obtained.   Description of procedure:  The patient was taken to the operating room and a general anesthetic was administered. The patient was given preoperative antibiotics, placed in the left modified flank position with care to pad all potential pressure points, and prepped and draped in the usual sterile fashion. Next a preoperative timeout was performed.  A site was selected on the left side of the umbilicus for placement of the camera port. This was placed using a standard open Hassan technique which allowed entry into the peritoneal cavity under direct vision and without difficulty. A 12 mm port was placed and a pneumoperitoneum  established. The camera was then used to inspect the abdomen and there was no evidence of any intra-abdominal injuries or other abnormalities except for some mild adhesions between the omentum and abdominal wall. The remaining abdominal ports were then placed. 8 mm robotic ports were placed in the left upper quadrant, left lower quadrant, and far left lateral abdominal wall. A 12 mm port was placed in the upper midline for laparoscopic assistance. All ports were placed under direct vision without difficulty. The surgical cart was then docked.   Utilizing the cautery scissors, the adhesions were taken down and the white line of Toldt was incised allowing the colon to be mobilized medially and the plane between the mesocolon and the anterior layer of Gerota's fascia to be developed and the kidney to be exposed.  The ureter and gonadal vein were identified inferiorly and the ureter was lifted anteriorly off the psoas muscle.  Dissection proceeded superiorly along the gonadal vein until the renal vein was identified.  The renal hilum was then carefully isolated with a combination of blunt and sharp dissection allowing the renal arterial and venous structures to be separated and isolated in preparation for renal hilar vessel clamping. There was a circumaortic renal vein with a lower pole posterior vein entering the kidney and an upper pole vein more anteriorly.  There were three renal arteries including an isolated lower pole artery and two communicating upper pole arteries.  12.5 g of IV mannitol was then administered.   Attention turned to the kidney and the perinephric fat surrounding the renal mass was removed and the kidney was mobilized sufficiently for exposure and resection of the renal mass. The mass was  noted to be cystic and encompassing the majority of the lower pole of the kidney anteriorly. It extended up to renal hilum but it was felt that a partial nephrectomy could be safely performed.  Once the  renal mass was properly isolated, preparations were made for resection of the tumor.  Reconstructive sutures were placed into the abdomen for the renorrhaphy portion of the procedure.  Each of the three renal arteries were then clamped with bulldog clamps.  The tumor was then excised with cold scissor dissection along with an adequate visible gross margin of normal renal parenchyma. The tumor appeared to be excised without any gross violation of the tumor. The renal collecting system was clearly entered during removal of the tumor and tumor removal required amputation of a lower pole calyx.  Inspection into the lower pole of the renal pelvis revealed the patient's renal stone which was then carefully removed and sent for analysis.  A running 3-0 V-lock suture was then used to close the collecting system entries and another 3-0 V-lock suture was brought through the capsule of the kidney and run along the base of the renal defect to provide hemostasis and close any entry into the renal collecting system if present. Weck clips were used to secure this suture outside the renal capsule at the proximal and distal ends. An additional hemostatic agent (Surgiflo) was then placed into the renal defect. A running 2-0 V lock suture was then used to close the renal capsule using a sliding clip technique which resulted in excellent compression of the renal defect.    The bulldog clamps were then removed from the renal hilar vessel(s) and an additional 12.5 g of IV mannitol was administered. Total warm renal ischemia time was 33 minutes. The renal tumor resection site was examined. Hemostasis appeared adequate.   The kidney was placed back into its normal anatomic position and covered with perinephric fat as needed.  A # 15 Blake drain was then brought through the lateral lower port site and positioned in the perinephric space.  It was secured to the skin with a nylon suture. The surgical cart was undocked.  The renal tumor  specimen was removed intact within an endopouch retrieval bag via the camera port sites.  The camera port site and the other 12 mm port site were then closed at the fascial level with 0-vicryl suture.  All other laparoscopic/robotic ports were removed under direct vision and the pneumoperitoneum let down with inspection of the operative field performed and hemostasis again confirmed. All incision sites were then injected with local anesthetic and reapproximated at the skin level with 4-0 monocryl subcuticular closures.  Dermabond was applied to the skin.  The patient tolerated the procedure well and without complications.  The patient was able to be extubated and transferred to the recovery unit in satisfactory condition.  Moody Bruins MD

## 2012-12-18 NOTE — Interval H&P Note (Signed)
History and Physical Interval Note:  12/18/2012 7:08 AM  Emily Cooper  has presented today for surgery, with the diagnosis of LEFT RENAL MASS, LEFT RENAL CALCULUS  The various methods of treatment have been discussed with the patient and family. After consideration of risks, benefits and other options for treatment, the patient has consented to  Procedure(s): ROBOTIC ASSITED PARTIAL NEPHRECTOMY AND POSSIBLE LEFT PYELOLITHOYOMY (Left) as a surgical intervention .  The patient's history has been reviewed, patient examined, no change in status, stable for surgery.  I have reviewed the patient's chart and labs.  Questions were answered to the patient's satisfaction.     Darthy Manganelli,LES

## 2012-12-18 NOTE — Progress Notes (Signed)
Patient ID: Emily Cooper, female   DOB: 11/11/1950, 62 y.o.   MRN: [id number] Post-op note  Subjective: The patient is doing well.  No complaints except soreness.  Eating soup.  Denies N/V  Objective: Vital signs in last 24 hours: Temp:  [97.7 F (36.5 C)-99.2 F (37.3 C)] 99 F (37.2 C) (12/01 1409) Pulse Rate:  [89-100] 98 (12/01 1409) Resp:  [14-18] 15 (12/01 1409) BP: (105-149)/(65-95) 113/69 mmHg (12/01 1409) SpO2:  [95 %-100 %] 96 % (12/01 1409) Weight:  [91.173 kg (201 lb)] 91.173 kg (201 lb) (12/01 1409)  Intake/Output from previous day:   Intake/Output this shift: Total I/O In: 3300 [I.V.:3200; IV Piggyback:100] Out: 657 [Urine:462; Drains:70; Blood:125]  Physical Exam:  General: Alert and oriented. Abdomen: Soft, Nondistended. Incisions: Clean and dry.  Lab Results:  Recent Labs  12/18/12 1233  HGB 11.2*  HCT 34.0*    Assessment/Plan: POD#0   1) Continue to monitor  2) DVT prophy, clears, bed rest, I.S    LOS: 0 days   YARBROUGH,Larayah Clute G. 12/18/2012, 3:27 PM

## 2012-12-18 NOTE — Transfer of Care (Signed)
Immediate Anesthesia Transfer of Care Note  Patient: Emily Cooper  Procedure(s) Performed: Procedure(s): ROBOTIC ASSITED PARTIAL NEPHRECTOMY AND  LEFT PYELOLITHOTOMY (Left)  Patient Location: PACU  Anesthesia Type:General  Level of Consciousness: oriented, patient cooperative and responds to stimulation  Airway & Oxygen Therapy: Patient Spontanous Breathing and Patient connected to face mask oxygen  Post-op Assessment: Report given to PACU RN, Post -op Vital signs reviewed and stable and Patient moving all extremities  Post vital signs: Reviewed and stable  Complications: No apparent anesthesia complications

## 2012-12-19 ENCOUNTER — Encounter (HOSPITAL_COMMUNITY): Payer: Self-pay | Admitting: Urology

## 2012-12-19 LAB — BASIC METABOLIC PANEL
CO2: 25 mEq/L (ref 19–32)
Calcium: 8.9 mg/dL (ref 8.4–10.5)
GFR calc non Af Amer: 89 mL/min — ABNORMAL LOW (ref >90)
Glucose, Bld: 117 mg/dL — ABNORMAL HIGH (ref 70–99)
Potassium: 3.7 mEq/L (ref 3.5–5.1)
Sodium: 135 mEq/L (ref 135–145)

## 2012-12-19 LAB — HEMOGLOBIN AND HEMATOCRIT, BLOOD
HCT: 29.4 % — ABNORMAL LOW (ref 36.0–46.0)
Hemoglobin: 9.3 g/dL — ABNORMAL LOW (ref 12.0–15.0)

## 2012-12-19 MED ORDER — ACETAMINOPHEN 325 MG PO TABS
650.0000 mg | ORAL_TABLET | Freq: Four times a day (QID) | ORAL | Status: DC | PRN
  Administered 2012-12-19 – 2012-12-20 (×2): 650 mg via ORAL
  Filled 2012-12-19 (×2): qty 2

## 2012-12-19 MED ORDER — HYDROCODONE-ACETAMINOPHEN 5-325 MG PO TABS
1.0000 | ORAL_TABLET | Freq: Four times a day (QID) | ORAL | Status: DC | PRN
  Administered 2012-12-19: 2 via ORAL
  Filled 2012-12-19 (×2): qty 2

## 2012-12-19 MED ORDER — BISACODYL 10 MG RE SUPP
10.0000 mg | Freq: Once | RECTAL | Status: AC
  Administered 2012-12-19: 10 mg via RECTAL
  Filled 2012-12-19: qty 1

## 2012-12-19 MED ORDER — OXYCODONE-ACETAMINOPHEN 5-325 MG PO TABS
1.0000 | ORAL_TABLET | Freq: Four times a day (QID) | ORAL | Status: DC | PRN
  Administered 2012-12-19 – 2012-12-21 (×6): 2 via ORAL
  Filled 2012-12-19 (×7): qty 2

## 2012-12-19 MED ORDER — MORPHINE SULFATE 2 MG/ML IJ SOLN
2.0000 mg | INTRAMUSCULAR | Status: DC | PRN
  Administered 2012-12-19 (×2): 4 mg via INTRAVENOUS
  Administered 2012-12-19 – 2012-12-20 (×3): 2 mg via INTRAVENOUS
  Filled 2012-12-19: qty 2
  Filled 2012-12-19 (×2): qty 1
  Filled 2012-12-19 (×2): qty 2
  Filled 2012-12-19: qty 1

## 2012-12-19 NOTE — Progress Notes (Signed)
Patient ambulate 20 feet this am with walker. Patient tolerated fairy well. Incentive spirometer used and encouraged. Will continue to monitor patient. Setzer, Don Broach

## 2012-12-19 NOTE — Progress Notes (Signed)
Pt temp increased from 101.6 to 102.8, doctor notified. Will continue to monitor.

## 2012-12-19 NOTE — Progress Notes (Addendum)
Patient ID: Emily Cooper, female   DOB: 06-23-1950, 62 y.o.   MRN: [id number]  1 Day Post-Op Subjective: Pt doing well. Complains of pain at incision sites. No nausea and vomiting.  Objective: Vital signs in last 24 hours: Temp:  [97.7 F (36.5 C)-99 F (37.2 C)] 99 F (37.2 C) (12/02 0525) Pulse Rate:  [84-100] 84 (12/02 0525) Resp:  [14-17] 16 (12/02 0525) BP: (92-133)/(55-83) 100/55 mmHg (12/02 0525) SpO2:  [95 %-100 %] 100 % (12/02 0525) Weight:  [91.173 kg (201 lb)] 91.173 kg (201 lb) (12/01 1409)  Intake/Output from previous day: 12/01 0701 - 12/02 0700 In: 5690 [P.O.:240; I.V.:5100; IV Piggyback:350] Out: 1662 [Urine:1412; Drains:125; Blood:125] Intake/Output this shift:    Physical Exam:  General: Alert and oriented CV: RRR Lungs: Clear Abdomen: Soft, ND, Positive BS Incisions: C/D/I Ext: NT, No erythema  Lab Results:  Recent Labs  12/18/12 1233 12/19/12 0516  HGB 11.2* 9.3*  HCT 34.0* 29.4*   BMET  Recent Labs  12/18/12 1233 12/19/12 0516  NA 136 135  K 3.8 3.7  CL 100 101  CO2 23 25  GLUCOSE 141* 117*  BUN 13 9  CREATININE 0.81 0.77  CALCIUM 9.3 8.9     Studies/Results: No results found.  Assessment/Plan: POD # 1 s/p left robotic partial nephrectomy - Dulcolax suppository - Continue catheter for today - Follow renal function and Hgb - Ambulate - Oral pain mediciaon - Advance diet - Steroid taper    LOS: 1 day   Betsey Sossamon,LES 12/19/2012, 7:16 AM

## 2012-12-19 NOTE — Progress Notes (Signed)
Patient ambulated 50 feet in hallway with a front wheel walker. Incentive spirometer encouraged. Will continue to encourage to ambulate. Setzer, Don Broach

## 2012-12-20 LAB — BASIC METABOLIC PANEL
Calcium: 8.6 mg/dL (ref 8.4–10.5)
GFR calc Af Amer: >90 mL/min (ref >90)
GFR calc non Af Amer: 79 mL/min — ABNORMAL LOW (ref >90)
Glucose, Bld: 135 mg/dL — ABNORMAL HIGH (ref 70–99)
Potassium: 3.7 mEq/L (ref 3.5–5.1)
Sodium: 134 mEq/L — ABNORMAL LOW (ref 135–145)

## 2012-12-20 LAB — HEMOGLOBIN AND HEMATOCRIT, BLOOD
HCT: 28.5 % — ABNORMAL LOW (ref 36.0–46.0)
Hemoglobin: 9 g/dL — ABNORMAL LOW (ref 12.0–15.0)

## 2012-12-20 MED ORDER — PREDNISONE 10 MG PO TABS
10.0000 mg | ORAL_TABLET | Freq: Two times a day (BID) | ORAL | Status: DC
  Administered 2012-12-20 – 2012-12-21 (×2): 10 mg via ORAL
  Filled 2012-12-20 (×4): qty 1

## 2012-12-20 NOTE — Progress Notes (Signed)
Pt temp has decreased to 100.2. Encouraged usage of incentive spirometer and fluid intake. Will continue to monitor

## 2012-12-20 NOTE — Progress Notes (Addendum)
Patient ID: Emily Cooper, female   DOB: 12-28-1950, 62 y.o.   MRN: [id number]  2 Days Post-Op Subjective: The patient is doing well.  No nausea or vomiting. Positive flatus. Pain at incision sites still.  Switched to [employer] last night.  She did develop fever last night which improved after incentive spirometry.    Objective: Vital signs in last 24 hours: Temp:  [98.2 F (36.8 C)-102.8 F (39.3 C)] 98.2 F (36.8 C) (12/03 0552) Pulse Rate:  [84-111] 84 (12/03 0552) Resp:  [16-18] 18 (12/03 0552) BP: (96-124)/(55-65) 96/55 mmHg (12/03 0552) SpO2:  [95 %-100 %] 98 % (12/03 0552)  Intake/Output from previous day: 12/02 0701 - 12/03 0700 In: 2763.3 [P.O.:800; I.V.:1963.3] Out: 2260 [Urine:2190; Drains:70] Intake/Output this shift:    Physical Exam:  General: Alert and oriented. CV: RRR Lungs: Clear bilaterally. GI: Soft, Nondistended. Normal bowel sounds. Incisions: Clean and dry and intact. Urine: Clear Extremities: Nontender, no erythema, no edema.  Lab Results:  Recent Labs  12/18/12 1233 12/19/12 0516 12/20/12 0433  HGB 11.2* 9.3* 9.0*  HCT 34.0* 29.4* 28.5*          Recent Labs  12/18/12 1233 12/19/12 0516 12/20/12 0433  CREATININE 0.81 0.77 0.80           Results for orders placed during the hospital encounter of 12/18/12 (from the past 24 hour(s))  BASIC METABOLIC PANEL     Status: Abnormal   Collection Time    12/20/12  4:33 AM      Result Value Range   Sodium 134 (*) 135 - 145 mEq/L   Potassium 3.7  3.5 - 5.1 mEq/L   Chloride 102  96 - 112 mEq/L   CO2 25  19 - 32 mEq/L   Glucose, Bld 135 (*) 70 - 99 mg/dL   BUN 10  6 - 23 mg/dL   Creatinine, Ser 4.09  0.50 - 1.10 mg/dL   Calcium 8.6  8.4 - 81.1 mg/dL   GFR calc non Af Amer 79 (*) >90 mL/min   GFR calc Af Amer >90  >90 mL/min  HEMOGLOBIN AND HEMATOCRIT, BLOOD     Status: Abnormal   Collection Time    12/20/12  4:33 AM      Result Value Range   Hemoglobin 9.0 (*) 12.0 - 15.0 g/dL   HCT  91.4 (*) 78.2 - 46.0 %    Assessment/Plan: POD# 2 s/p robotic partial nephrectomy.  1) Ambulate, Incentive spirometry 2) D/C catheter 3) Sl IVF 4) Check urine culture and monitor for further fever although I suspect atelectasis as the cause.  Expressed importance of ambulation and IS use today. 5) Continue transition to po pain meds.  IV meds for breakthrough pain only. 6) Continue steroid taper  Moody Bruins. MD   LOS: 2 days   Geovannie Vilar,LES 12/20/2012, 7:17 AM

## 2012-12-21 LAB — CBC WITH DIFFERENTIAL/PLATELET
Eosinophils Absolute: 0.1 10*3/uL (ref 0.0–0.7)
Eosinophils Relative: 1 % (ref 0–5)
HCT: 29.8 % — ABNORMAL LOW (ref 36.0–46.0)
Lymphocytes Relative: 9 % — ABNORMAL LOW (ref 12–46)
Lymphs Abs: 1.2 10*3/uL (ref 0.7–4.0)
MCH: 26.1 pg (ref 26.0–34.0)
MCHC: 30.9 g/dL (ref 30.0–36.0)
MCV: 84.4 fL (ref 78.0–100.0)
Monocytes Absolute: 0.7 10*3/uL (ref 0.1–1.0)
Platelets: 252 10*3/uL (ref 150–400)
RBC: 3.53 MIL/uL — ABNORMAL LOW (ref 3.87–5.11)
WBC: 12.8 10*3/uL — ABNORMAL HIGH (ref 4.0–10.5)

## 2012-12-21 MED ORDER — OXYCODONE-ACETAMINOPHEN 5-325 MG PO TABS: 1.0000 | ORAL_TABLET | Freq: Four times a day (QID) | ORAL | Status: DC | PRN

## 2012-12-21 NOTE — Progress Notes (Signed)
Pt ambulated in hall with no complaints of dizziness. Will discharge pt per PA order. Julio Sicks RN

## 2012-12-21 NOTE — Progress Notes (Signed)
A call back from [facility] revealed, they only have NA's. [facility] [phone] was called for HHPT, information faxed [phone].

## 2012-12-21 NOTE — Evaluation (Signed)
Physical Therapy One Time Evaluation Patient Details Name: Helene Bernstein MRN: [id number] DOB: April 26, 1950 Today's Date: 12/21/2012 Time: 4098-1191 PT Time Calculation (min): 15 min  PT Assessment / Plan / Recommendation History of Present Illness  62 year old female seen in consultation at the request of Dr. Wendall Stade for a left complex renal mass. She has a history of kidney stones s/p ESWL in the past. She presented in late October with left flank pain and hematuria and was found to have a 1 cm lower pole left renal stone which was thought to probably be causing intermittent obstruction. On her non-contrasted CT scan, she was noted to have a possible complex cystic mass of the lower pole of the left kidney. She underwent cystoscopy and stent placement in preparation for ESWL therapy. On further evaluation with ultrasound, this complex mass appeared to be worrisome and MR imaging was recommended. She did undergo an MRI of the abdomen with and without contrast on 11/29/12 which showed a 4.2 cm complex cystic mass off the anterior lower pole of the left kidney. This demonstrated multiple thin septae which appeared to be enhancing raising concern for a possible cystic renal cell carcinoma. 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  Clinical Impression  Pt POD # 3 s/p left robotic partial nephrectomy and scheduled for discharge however PT evaluation requested for home needs.  Patient evaluated by Physical Therapy with no further acute PT needs identified. All education has been completed and the patient has no further questions.  Pt with limited mobility and increased time to perform mobility due to pain today.  Pt reports she needs RW for better support and to assist with pain.  Pt also reports difficulty getting on/off toilet so recommend BSC.  Pt gait trained using RW and recommend HHPT for f/u needs. PT is signing off. Thank you for this referral.     PT Assessment  All further PT  needs can be met in the next venue of care    Follow Up Recommendations  Home health PT    Does the patient have the potential to tolerate intense rehabilitation      Barriers to Discharge        Equipment Recommendations  Rolling walker with 5" wheels;3in1 (PT)    Recommendations for Other Services     Frequency      Precautions / Restrictions Precautions Precautions: Fall   Pertinent Vitals/Pain Pt reports premedicated, pain only a little better with rest per pt report      Mobility  Bed Mobility Bed Mobility: Not assessed Details for Bed Mobility Assistance: pt up in chair on arrival Transfers Transfers: Sit to Stand;Stand to Sit Sit to Stand: 5: Supervision;From chair/3-in-1;With armrests Stand to Sit: 5: Supervision;To chair/3-in-1;With armrests Details for Transfer Assistance: supervision due to increased abdominal pain, increased time scooting to edge of chair as well as transfers due to pain, vebral cues for hand placement Ambulation/Gait Ambulation/Gait Assistance: 5: Supervision Ambulation Distance (Feet): 20 Feet Assistive device: Rolling walker Ambulation/Gait Assistance Details: increased time due to pain, one instance of leaning forward with UEs resting on RW due to fatigue/pain, pt self-limited distance Gait Pattern: Step-through pattern;Decreased stride length;Trunk flexed Gait velocity: decr    Exercises     PT Diagnosis: Difficulty walking  PT Problem List: Decreased mobility;Decreased activity tolerance;Decreased knowledge of use of DME;Pain PT Treatment Interventions:       PT Goals(Current goals can be found in the care plan section) Acute Rehab  PT Goals PT Goal Formulation: No goals set, d/c therapy  Visit Information  Last PT Received On: 12/21/12 Assistance Needed: +1 History of Present Illness: 62 year old female seen in consultation at the request of Dr. Wendall Stade for a left complex renal mass. She has a history of kidney stones s/p  ESWL in the past. She presented in late October with left flank pain and hematuria and was found to have a 1 cm lower pole left renal stone which was thought to probably be causing intermittent obstruction. On her non-contrasted CT scan, she was noted to have a possible complex cystic mass of the lower pole of the left kidney. She underwent cystoscopy and stent placement in preparation for ESWL therapy. On further evaluation with ultrasound, this complex mass appeared to be worrisome and MR imaging was recommended. She did undergo an MRI of the abdomen with and without contrast on 11/29/12 which showed a 4.2 cm complex cystic mass off the anterior lower pole of the left kidney. This demonstrated multiple thin septae which appeared to be enhancing raising concern for a possible cystic renal cell carcinoma. 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       Prior Functioning  Home Living Family/patient expects to be discharged to:: Private residence Living Arrangements: Alone Available Help at Discharge: Available PRN/intermittently;Family Type of Home: House Home Access: Stairs to enter [employer] of Steps: 3 Home Layout: Able to live on main level with bedroom/bathroom;Two level Home Equipment: Walker - standard;Cane - single point Prior Function Level of Independence: Independent Communication Communication: No difficulties    Cognition  Cognition Arousal/Alertness: Awake/alert Behavior During Therapy: WFL for tasks assessed/performed Overall Cognitive Status: Within Functional Limits for tasks assessed    Extremity/Trunk Assessment Upper Extremity Assessment Upper Extremity Assessment: Overall WFL for tasks assessed (reports hx of RA) Lower Extremity Assessment Lower Extremity Assessment: Overall WFL for tasks assessed   Balance    End of Session PT - End of Session Activity Tolerance: Patient limited by pain Patient left: in chair;with call bell/phone within  reach;with family/visitor present Nurse Communication: Mobility status  GP     Ahmaya Ostermiller,KATHrine E 12/21/2012, 2:05 PM Zenovia Jarred, PT, DPT 12/21/2012 Pager: [phone]

## 2012-12-21 NOTE — Progress Notes (Signed)
[facility]  [facility] is providing the following services: RW and Com[employer]  If patient discharges after hours, please call [phone].   Renard Hamper 12/21/2012, 3:16 PM

## 2012-12-21 NOTE — Progress Notes (Signed)
Spoke with pt and her daughter via phone concerning HHPT. [facility] [phone], was selected, VM left x2. Will continue to call [facility].

## 2012-12-21 NOTE — Progress Notes (Addendum)
Patient ID: Emily Cooper, female   DOB: 09/03/1950, 62 y.o.   MRN: [id number]  3 Days Post-Op Subjective: Pt doing well overall although did get a little dizzy after walking last night. Low grade fever yesterday although afebrile now.  She is passing flatus and tolerating regular diet.  Pain is controlled with only oral pain medication now.  Objective: Vital signs in last 24 hours: Temp:  [98.6 F (37 C)-101 F (38.3 C)] 98.6 F (37 C) (12/04 0349) Pulse Rate:  [68-118] 100 (12/03 2302) Resp:  [18] 18 (12/03 2302) BP: (111-133)/(51-68) 111/68 mmHg (12/03 2302) SpO2:  [92 %-93 %] 92 % (12/03 2302)  Intake/Output from previous day: 12/03 0701 - 12/04 0700 In: 930 [P.O.:480; I.V.:450] Out: 780 [Urine:740; Drains:40] Intake/Output this shift:    Physical Exam:  General: Alert and oriented CV: RRR Lungs: Clear Abdomen: Soft, ND, Positive BS Incisions: C/D/I Ext: NT, No erythema  Lab Results:  Recent Labs  12/19/12 0516 12/20/12 0433 12/21/12 0415  HGB 9.3* 9.0* 9.2*  HCT 29.4* 28.5* 29.8*   Drain Cr is 0.7.  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   LOS: 3 days   Pearly Bartosik,LES 12/21/2012, 7:04 AM

## 2012-12-22 NOTE — Discharge Summary (Signed)
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 Briefly, Emily Cooper is a 62 y.o. year old patient seen in consultation at the request of Dr. Wendall Stade for a left complex renal mass. She has a history of kidney stones s/p ESWL in the past. She presented in late October with left flank pain and hematuria and was found to have a 1 cm lower pole left renal stone which was thought to probably be causing intermittent obstruction. On her non-contrasted CT scan, she was noted to have a possible complex cystic mass of the lower pole of the left kidney. She underwent cystoscopy and stent placement in preparation for ESWL therapy. On further evaluation with ultrasound, this complex mass appeared to be worrisome and MR imaging was recommended. She did undergo an MRI of the abdomen with and without contrast on 11/29/12 which showed a 4.2 cm complex cystic mass off the anterior lower pole of the left kidney. This demonstrated multiple thin septae which appeared to be enhancing raising concern for a possible cystic renal cell carcinoma. 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 There was no evidence of regional lymphadenopathy, contralateral renal masses except for simple appearing cysts, and no evidence of other abdominal metastases. There was a single branching renal artery to the left kidney and a circumaortic renal vein. Her baseline renal function is normal. Serum Cr was 0.65.  She has not yet been treated for her stone pending further evaluation of her renal mass. She does continue to have some intermittent hematuria since her stent was placed and does have some frequency and urgency  symptoms. She will occasionally have some flank pain although this is fairly minimal. She has no family history of kidney cancer.   Hospital Course: Pt was admitted and taken to the OR on 12/18/12 for a left robotic assisted laparoscopic partial nephrectomy and pyelolithotomy.  Pt tolerated the procedure well and was hemodynamically stable immediately post op.  She was extubated without complication and woke up from anesthesia neurologically intact.  She was transferred to the PACU and then to the floor without difficulty. Her post op course was complicated by a fever which resolved with use of I.S. and by difficulty with mobility due to decreased strength and pain issues.  She received a PT eval and HHPT was recommended for the pt as well as a bed side commode and walker.  She was able to tolerate a regular diet, void without difficulty, and passing flatus prior to discharge.  Her JP drainage was checked for Cr and this value was consistent with serum, therefore, the drain was d/c'd without difficulty.  She was maintained on a steroid taper post operatively due to her home use of Prednisone for her RA.  On POD 3 the pt was doing well and felt stable for d/c home.    Laboratory values:  Recent Labs  12/20/12 0433 12/21/12 0415  HGB 9.0* 9.2*  HCT 28.5* 29.8*    Recent Labs  12/20/12 0433  CREATININE 0.80    Disposition: Home  Discharge instruction: The patient was instructed to be ambulatory but told to refrain from heavy lifting, strenuous activity, or driving. HHPT was established to aid the pt with ambulation.  Discharge medications:    Medication List    STOP taking these medications       ergocalciferol 50000 UNITS capsule  Commonly known as:  VITAMIN D2     folic acid 1 MG tablet  Commonly known as:  FOLVITE     HYDROcodone-acetaminophen 5-325 MG per tablet  Commonly known as:  NORCO/VICODIN      TAKE these medications       albuterol 108 (90 BASE) MCG/ACT inhaler   Commonly known as:  PROVENTIL HFA;VENTOLIN HFA  Inhale 1 puff into the lungs every 6 (six) hours as needed for wheezing or shortness of breath.     amLODipine-valsartan 10-320 MG per tablet  Commonly known as:  EXFORGE  Take 1 tablet by mouth daily.     esomeprazole 40 MG capsule  Commonly known as:  NEXIUM  Take 40 mg by mouth 2 (two) times daily before a meal.     lidocaine 5 %  Commonly known as:  LIDODERM  Place 1 patch onto the skin daily. Remove & Discard patch within 12 hours or as directed by MD     methotrexate 2.5 MG tablet  Commonly known as:  RHEUMATREX  Take 2.5 mg by mouth once a week. Caution:Chemotherapy. Protect from light.     oxyCODONE-acetaminophen 5-325 MG per tablet  Commonly known as:  PERCOCET/ROXICET  Take 1-2 tablets by mouth every 6 (six) hours as needed for moderate pain.     pravastatin 80 MG tablet  Commonly known as:  PRAVACHOL  Take 80 mg by mouth daily.     sertraline 100 MG tablet  Commonly known as:  ZOLOFT  Take 100 mg by mouth daily.     triamcinolone ointment 0.1 %  Commonly known as:  KENALOG  Apply 1 application topically 2 (two) times daily.        Followup:      Follow-up Information   Follow up with Crecencio Mc, MD On 01/09/2013. (at 11:00)    Specialty:  Urology   Contact information:   [address] [place] [address] [phone]       Follow up with Crecencio Mc, MD. (8:45 on 12/26/12)    Specialty:  Urology   Contact information:   [address] [place] [address] [phone]

## 2013-07-04 ENCOUNTER — Ambulatory Visit (HOSPITAL_COMMUNITY)
Admission: RE | Admit: 2013-07-04 | Discharge: 2013-07-04 | Disposition: A | Discharge status: Home / Self Care | Payer: Medicare Other | Source: Ambulatory Visit | Attending: Urology | Admitting: Urology

## 2013-07-04 ENCOUNTER — Other Ambulatory Visit (HOSPITAL_COMMUNITY): Payer: Self-pay | Admitting: Urology

## 2013-07-04 DIAGNOSIS — C649 Malignant neoplasm of unspecified kidney, except renal pelvis: Secondary | ICD-10-CM

## 2013-08-21 ENCOUNTER — Emergency Department (HOSPITAL_COMMUNITY)
Admission: EM | Admit: 2013-08-21 | Discharge: 2013-08-21 | Disposition: A | Discharge status: Home / Self Care | Payer: Medicare Other | Attending: Emergency Medicine | Admitting: Emergency Medicine

## 2013-08-21 ENCOUNTER — Encounter (HOSPITAL_COMMUNITY): Payer: Self-pay | Admitting: Emergency Medicine

## 2013-08-21 ENCOUNTER — Emergency Department (HOSPITAL_COMMUNITY): Payer: Medicare Other

## 2013-08-21 DIAGNOSIS — N289 Disorder of kidney and ureter, unspecified: Secondary | ICD-10-CM

## 2013-08-21 DIAGNOSIS — E7800 Pure hypercholesterolemia, unspecified: Secondary | ICD-10-CM | POA: Diagnosis not present

## 2013-08-21 DIAGNOSIS — Z79899 Other long term (current) drug therapy: Secondary | ICD-10-CM | POA: Diagnosis not present

## 2013-08-21 DIAGNOSIS — Z8659 Personal history of other mental and behavioral disorders: Secondary | ICD-10-CM | POA: Diagnosis not present

## 2013-08-21 DIAGNOSIS — I10 Essential (primary) hypertension: Secondary | ICD-10-CM | POA: Diagnosis not present

## 2013-08-21 DIAGNOSIS — Z859 Personal history of malignant neoplasm, unspecified: Secondary | ICD-10-CM | POA: Insufficient documentation

## 2013-08-21 DIAGNOSIS — K219 Gastro-esophageal reflux disease without esophagitis: Secondary | ICD-10-CM | POA: Diagnosis not present

## 2013-08-21 DIAGNOSIS — IMO0002 Reserved for concepts with insufficient information to code with codable children: Secondary | ICD-10-CM | POA: Diagnosis not present

## 2013-08-21 DIAGNOSIS — R110 Nausea: Secondary | ICD-10-CM

## 2013-08-21 DIAGNOSIS — N390 Urinary tract infection, site not specified: Secondary | ICD-10-CM | POA: Insufficient documentation

## 2013-08-21 DIAGNOSIS — J40 Bronchitis, not specified as acute or chronic: Secondary | ICD-10-CM

## 2013-08-21 DIAGNOSIS — Z8701 Personal history of pneumonia (recurrent): Secondary | ICD-10-CM | POA: Diagnosis not present

## 2013-08-21 DIAGNOSIS — E876 Hypokalemia: Secondary | ICD-10-CM | POA: Insufficient documentation

## 2013-08-21 DIAGNOSIS — Z87442 Personal history of urinary calculi: Secondary | ICD-10-CM | POA: Insufficient documentation

## 2013-08-21 DIAGNOSIS — M069 Rheumatoid arthritis, unspecified: Secondary | ICD-10-CM | POA: Insufficient documentation

## 2013-08-21 DIAGNOSIS — J209 Acute bronchitis, unspecified: Secondary | ICD-10-CM | POA: Insufficient documentation

## 2013-08-21 DIAGNOSIS — R0602 Shortness of breath: Secondary | ICD-10-CM | POA: Diagnosis present

## 2013-08-21 HISTORY — DX: Malignant (primary) neoplasm, unspecified: C80.1

## 2013-08-21 LAB — TROPONIN I: Troponin I: <0.3 ng/mL (ref <0.30)

## 2013-08-21 LAB — CBC WITH DIFFERENTIAL/PLATELET
BASOS PCT: 0 % (ref 0–1)
Basophils Absolute: 0 10*3/uL (ref 0.0–0.1)
EOS ABS: 0.1 10*3/uL (ref 0.0–0.7)
Eosinophils Relative: 1 % (ref 0–5)
HCT: 35.3 % — ABNORMAL LOW (ref 36.0–46.0)
HEMOGLOBIN: 11.3 g/dL — AB (ref 12.0–15.0)
Lymphocytes Relative: 38 % (ref 12–46)
Lymphs Abs: 3.7 10*3/uL (ref 0.7–4.0)
MCH: 27 pg (ref 26.0–34.0)
MCHC: 32 g/dL (ref 30.0–36.0)
MCV: 84.4 fL (ref 78.0–100.0)
MONOS PCT: 6 % (ref 3–12)
Monocytes Absolute: 0.6 10*3/uL (ref 0.1–1.0)
NEUTROS PCT: 55 % (ref 43–77)
Neutro Abs: 5.3 10*3/uL (ref 1.7–7.7)
Platelets: 287 10*3/uL (ref 150–400)
RBC: 4.18 MIL/uL (ref 3.87–5.11)
RDW: 16.1 % — ABNORMAL HIGH (ref 11.5–15.5)
WBC: 9.7 10*3/uL (ref 4.0–10.5)

## 2013-08-21 LAB — COMPREHENSIVE METABOLIC PANEL
ALBUMIN: 3.6 g/dL (ref 3.5–5.2)
ALK PHOS: 67 U/L (ref 39–117)
ALT: 22 U/L (ref 0–35)
ANION GAP: 12 (ref 5–15)
AST: 19 U/L (ref 0–37)
BUN: 13 mg/dL (ref 6–23)
CO2: 29 mEq/L (ref 19–32)
CREATININE: 1.38 mg/dL — AB (ref 0.50–1.10)
Calcium: 9.1 mg/dL (ref 8.4–10.5)
Chloride: 103 mEq/L (ref 96–112)
GFR calc Af Amer: 47 mL/min — ABNORMAL LOW (ref >90)
GFR calc non Af Amer: 41 mL/min — ABNORMAL LOW (ref >90)
Glucose, Bld: 152 mg/dL — ABNORMAL HIGH (ref 70–99)
POTASSIUM: 3.2 meq/L — AB (ref 3.7–5.3)
Sodium: 144 mEq/L (ref 137–147)
Total Bilirubin: 0.4 mg/dL (ref 0.3–1.2)
Total Protein: 7 g/dL (ref 6.0–8.3)

## 2013-08-21 LAB — URINE MICROSCOPIC-ADD ON

## 2013-08-21 LAB — URINALYSIS, ROUTINE W REFLEX MICROSCOPIC
Bilirubin Urine: NEGATIVE
Glucose, UA: NEGATIVE mg/dL
KETONES UR: NEGATIVE mg/dL
NITRITE: NEGATIVE
Protein, ur: NEGATIVE mg/dL
Specific Gravity, Urine: 1.01 (ref 1.005–1.030)
UROBILINOGEN UA: 0.2 mg/dL (ref 0.0–1.0)
pH: 5.5 (ref 5.0–8.0)

## 2013-08-21 LAB — PRO B NATRIURETIC PEPTIDE: PRO B NATRI PEPTIDE: 73.3 pg/mL (ref 0–125)

## 2013-08-21 MED ORDER — CEPHALEXIN 500 MG PO CAPS
500.0000 mg | ORAL_CAPSULE | Freq: Once | ORAL | Status: AC
  Administered 2013-08-21: 500 mg via ORAL
  Filled 2013-08-21: qty 1

## 2013-08-21 MED ORDER — ONDANSETRON HCL 4 MG/2ML IJ SOLN
4.0000 mg | Freq: Once | INTRAMUSCULAR | Status: AC
  Administered 2013-08-21: 4 mg via INTRAVENOUS
  Filled 2013-08-21: qty 2

## 2013-08-21 MED ORDER — SODIUM CHLORIDE 0.9 % IV BOLUS (SEPSIS)
1000.0000 mL | Freq: Once | INTRAVENOUS | Status: AC
  Administered 2013-08-21: 1000 mL via INTRAVENOUS

## 2013-08-21 MED ORDER — SODIUM CHLORIDE 0.9 % IV SOLN
INTRAVENOUS | Status: DC
  Administered 2013-08-21: 19:00:00 via INTRAVENOUS

## 2013-08-21 MED ORDER — POTASSIUM CHLORIDE CRYS ER 20 MEQ PO TBCR: 20.0000 meq | EXTENDED_RELEASE_TABLET | Freq: Two times a day (BID) | ORAL | Status: AC

## 2013-08-21 MED ORDER — ALBUTEROL SULFATE (2.5 MG/3ML) 0.083% IN NEBU
2.5000 mg | INHALATION_SOLUTION | Freq: Once | RESPIRATORY_TRACT | Status: AC
  Administered 2013-08-21: 2.5 mg via RESPIRATORY_TRACT
  Filled 2013-08-21: qty 3

## 2013-08-21 MED ORDER — IPRATROPIUM BROMIDE 0.02 % IN SOLN: 0.5000 mg | Freq: Once | RESPIRATORY_TRACT | Status: DC

## 2013-08-21 MED ORDER — POTASSIUM CHLORIDE CRYS ER 20 MEQ PO TBCR
40.0000 meq | EXTENDED_RELEASE_TABLET | Freq: Once | ORAL | Status: AC
  Administered 2013-08-21: 40 meq via ORAL
  Filled 2013-08-21: qty 2

## 2013-08-21 MED ORDER — IPRATROPIUM-ALBUTEROL 0.5-2.5 (3) MG/3ML IN SOLN
3.0000 mL | Freq: Once | RESPIRATORY_TRACT | Status: AC
  Administered 2013-08-21: 3 mL via RESPIRATORY_TRACT
  Filled 2013-08-21: qty 3

## 2013-08-21 MED ORDER — CEPHALEXIN 500 MG PO CAPS: 500.0000 mg | ORAL_CAPSULE | Freq: Three times a day (TID) | ORAL | Status: DC

## 2013-08-21 MED ORDER — ONDANSETRON HCL 4 MG PO TABS: 4.0000 mg | ORAL_TABLET | Freq: Three times a day (TID) | ORAL | Status: AC | PRN

## 2013-08-21 MED ORDER — ALBUTEROL SULFATE (2.5 MG/3ML) 0.083% IN NEBU: 5.0000 mg | INHALATION_SOLUTION | Freq: Once | RESPIRATORY_TRACT | Status: DC

## 2013-08-21 NOTE — Discharge Instructions (Signed)
Drink plenty of fluids. Take the potassium pills until gone. Take the antibiotic pills until gone. Use your inhaler as needed for wheezing and shortness of breath. Use the zofran for nausea. Recheck if you get a fever, struggle to breathe, get worsening abdominal pain or you feel worse.  Hypokalemia Hypokalemia means that the amount of potassium in the blood is lower than normal.Potassium is a chemical, called an electrolyte, that helps regulate the amount of fluid in the body. It also stimulates muscle contraction and helps nerves function properly.Most of the body's potassium is inside of cells, and only a very small amount is in the blood. Because the amount in the blood is so small, minor changes can be life-threatening. CAUSES  Antibiotics.  Diarrhea or vomiting.  Using laxatives too much, which can cause diarrhea.  Chronic kidney disease.  Water pills (diuretics).  Eating disorders (bulimia).  Low magnesium level.  Sweating a lot. SIGNS AND SYMPTOMS  Weakness.  Constipation.  Fatigue.  Muscle cramps.  Mental confusion.  Skipped heartbeats or irregular heartbeat (palpitations).  Tingling or numbness. DIAGNOSIS  Your health care provider can diagnose hypokalemia with blood tests. In addition to checking your potassium level, your health care provider may also check other lab tests. TREATMENT Hypokalemia can be treated with potassium supplements taken by mouth or adjustments in your current medicines. If your potassium level is very low, you may need to get potassium through a vein (IV) and be monitored in the hospital. A diet high in potassium is also helpful. Foods high in potassium are:  Nuts, such as peanuts and pistachios.  Seeds, such as sunflower seeds and pumpkin seeds.  Peas, lentils, and lima beans.  Whole grain and bran cereals and breads.  Fresh fruit and vegetables, such as apricots, avocado, bananas, cantaloupe, kiwi, oranges, tomatoes, asparagus,  and potatoes.  Orange and tomato juices.  Red meats.  Fruit yogurt. HOME CARE INSTRUCTIONS  Take all medicines as prescribed by your health care provider.  Maintain a healthy diet by including nutritious food, such as fruits, vegetables, nuts, whole grains, and lean meats.  If you are taking a laxative, be sure to follow the directions on the label. SEEK MEDICAL CARE IF:  Your weakness gets worse.  You feel your heart pounding or racing.  You are vomiting or having diarrhea.  You are diabetic and having trouble keeping your blood glucose in the normal range. SEEK IMMEDIATE MEDICAL CARE IF:  You have chest pain, shortness of breath, or dizziness.  You are vomiting or having diarrhea for more than 2 days.  You faint. MAKE SURE YOU:   Understand these instructions.  Will watch your condition.  Will get help right away if you are not doing well or get worse. Document Released: 01/04/2005 Document Revised: 10/25/2012 Document Reviewed: 07/07/2012 [facility] Patient Information 2015 [place], [place]. This information is not intended to replace advice given to you by your health care provider. Make sure you discuss any questions you have with your health care provider.  Nausea, Adult Nausea is the feeling that you have an upset stomach or have to vomit. Nausea by itself is not likely a serious concern, but it may be an early sign of more serious medical problems. As nausea gets worse, it can lead to vomiting. If vomiting develops, there is the risk of dehydration.  CAUSES   Viral infections.  Food poisoning.  Medicines.  Pregnancy.  Motion sickness.  Migraine headaches.  Emotional distress.  Severe pain from any source.  Alcohol intoxication. HOME CARE INSTRUCTIONS  Get plenty of rest.  Ask your caregiver about specific rehydration instructions.  Eat small amounts of food and sip liquids more often.  Take all medicines as told by your caregiver. SEEK  MEDICAL CARE IF:  You have not improved after 2 days, or you get worse.  You have a headache. SEEK IMMEDIATE MEDICAL CARE IF:   You have a fever.  You faint.  You keep vomiting or have blood in your vomit.  You are extremely weak or dehydrated.  You have dark or bloody stools.  You have severe chest or abdominal pain. MAKE SURE YOU:  Understand these instructions.  Will watch your condition.  Will get help right away if you are not doing well or get worse. Document Released: 02/12/2004 Document Revised: 09/29/2011 Document Reviewed: 09/16/2010 [facility] Patient Information 2015 [place], [place]. This information is not intended to replace advice given to you by your health care provider. Make sure you discuss any questions you have with your health care provider.  Urinary Tract Infection A urinary tract infection (UTI) can occur any place along the urinary tract. The tract includes the kidneys, ureters, bladder, and urethra. A type of germ called bacteria often causes a UTI. UTIs are often helped with antibiotic medicine.  HOME CARE   If given, take antibiotics as told by your doctor. Finish them even if you start to feel better.  Drink enough fluids to keep your pee (urine) clear or pale yellow.  Avoid tea, drinks with caffeine, and bubbly (carbonated) drinks.  Pee often. Avoid holding your pee in for a long time.  Pee before and after having sex (intercourse).  Wipe from front to back after you poop (bowel movement) if you are a woman. Use each tissue only once. GET HELP RIGHT AWAY IF:   You have back pain.  You have lower belly (abdominal) pain.  You have chills.  You feel sick to your stomach (nauseous).  You throw up (vomit).  Your burning or discomfort with peeing does not go away.  You have a fever.  Your symptoms are not better in 3 days. MAKE SURE YOU:   Understand these instructions.  Will watch your condition.  Will get help right away if you  are not doing well or get worse. Document Released: 06/23/2007 Document Revised: 09/29/2011 Document Reviewed: 08/05/2011 [facility] Patient Information 2015 [place], [place]. This information is not intended to replace advice given to you by your health care provider. Make sure you discuss any questions you have with your health care provider.  Upper Respiratory Infection, Adult An upper respiratory infection (URI) is also known as the common cold. It is often caused by a type of germ (virus). Colds are easily spread (contagious). You can pass it to others by kissing, coughing, sneezing, or drinking out of the same glass. Usually, you get better in 1 or 2 weeks.  HOME CARE   Only take medicine as told by your doctor.  Use a warm mist humidifier or breathe in steam from a hot shower.  Drink enough water and fluids to keep your pee (urine) clear or pale yellow.  Get plenty of rest.  Return to work when your temperature is back to normal or as told by your doctor. You may use a face mask and wash your hands to stop your cold from spreading. GET HELP RIGHT AWAY IF:   After the first few days, you feel you are getting worse.  You have questions  about your medicine.  You have chills, shortness of breath, or brown or red spit (mucus).  You have yellow or brown snot (nasal discharge) or pain in the face, especially when you bend forward.  You have a fever, puffy (swollen) neck, pain when you swallow, or white spots in the back of your throat.  You have a bad headache, ear pain, sinus pain, or chest pain.  You have a high-pitched whistling sound when you breathe in and out (wheezing).  You have a lasting cough or cough up blood.  You have sore muscles or a stiff neck. MAKE SURE YOU:   Understand these instructions.  Will watch your condition.  Will get help right away if you are not doing well or get worse. Document Released: 06/23/2007 Document Revised: 03/29/2011 Document Reviewed:  04/11/2013 [facility] Patient Information 2015 [place], [place]. This information is not intended to replace advice given to you by your health care provider. Make sure you discuss any questions you have with your health care provider.

## 2013-08-21 NOTE — ED Provider Notes (Signed)
CSN: [id number]     Arrival date & time 08/21/13  1706 History  This chart was scribed for Emily Norrie, MD by Lowella Petties, ED Scribe. The patient was seen in room APA14/APA14. Patient's care was started at 5:54 PM.   Chief Complaint  Patient presents with  . Shortness of Breath   The history is provided by the patient. No language interpreter was used.   HPI Comments: Emily Cooper is a 63 y.o. female who presents to the Emergency Department complaining of nausea without vomiting that began 1 week ago and acutely worsened today. She reports associated, intermittent, dull, periumbilical abdominal pain that lasts for about 2 minutes. She denies any modifying factors making the pain better or worse. She also reports SOB, dry cough for the past 2 weeks, and wheezing. She states that she is currently experiencing SOB. She reports using an inhaler for this with some relief. She states that she has had leg swelling for the past few days. She denies symptoms like this in the past, being exposed to sickness, or eating anything unusual. She denies eating today. She denies dysuria, frequency and has had a normal appetite despite the nausea or abdominal pain.   The patient reports seeing a PCP Dr. Nori Cooper in [place]. She reports seeing a Rumatologist Dr. Ena Cooper and a Gastrologist Dr. Posey Cooper. She reports a history of esophageal stricture that she gets stretched about twice a year, and that during her last procedure 2-3 months ago they found stomach inflammation. She reports that her lower back is hurting, and that her PCP told her that her bones were degenerating. She does not drink or smoke. She is not working currently, and she is on disability.   PCP Dr Emily Cooper in [place] Dr Emily Cooper GI Dr Emily Cooper  Past Medical History  Diagnosis Date  . Hypertension   . Hypercholesteremia   . Depression   . Pneumonia 08/2012    hx of  . GERD (gastroesophageal reflux disease)   . History of kidney  stones   . Headache(784.0)   . Arthritis   . Rheumatoid arthritis   . Cancer    Past Surgical History  Procedure Laterality Date  . Kidney stone surgery    . Cataract extraction Bilateral   . Esophageal dilation    . Robotic assited partial nephrectomy Left 12/18/2012    Procedure: ROBOTIC ASSITED PARTIAL NEPHRECTOMY AND  LEFT PYELOLITHOTOMY;  Surgeon: Dutch Gray, MD;  Location: [facility];  Service: Urology;  Laterality: Left;   History reviewed. No pertinent family history. History  Substance Use Topics  . Smoking status: Never Smoker   . Smokeless tobacco: Never Used  . Alcohol Use: No   Lives with daughter Pt on disability  OB History   Grav Para Term Preterm Abortions TAB SAB Ect Mult Living                 Review of Systems  All other systems reviewed and are negative.   Allergies  Naproxen  Home Medications   Prior to Admission medications   Medication Sig Start Date End Date Taking? Authorizing Provider  albuterol (PROVENTIL HFA;VENTOLIN HFA) 108 (90 BASE) MCG/ACT inhaler Inhale 1 puff into the lungs every 6 (six) hours as needed for wheezing or shortness of breath.   Yes Historical Provider, MD  amLODipine (NORVASC) 10 MG tablet Take 10 mg by mouth daily.   Yes Historical Provider, MD  esomeprazole (NEXIUM) 40 MG capsule Take 40 mg by mouth 2 (  two) times daily before a meal.   Yes Historical Provider, MD  furosemide (LASIX) 20 MG tablet Take 20 mg by mouth daily as needed for fluid (Takes in the morning as needed for swelling).   Yes Historical Provider, MD  HYDROcodone-acetaminophen (NORCO/VICODIN) 5-325 MG per tablet Take 1 tablet by mouth every 8 (eight) hours.   Yes Historical Provider, MD  losartan (COZAAR) 100 MG tablet Take 100 mg by mouth daily.   Yes Historical Provider, MD  montelukast (SINGULAIR) 10 MG tablet Take 10 mg by mouth every evening.   Yes Historical Provider, MD  pravastatin (PRAVACHOL) 80 MG tablet Take 80 mg by mouth daily.   Yes Historical  Provider, MD  predniSONE (DELTASONE) 10 MG tablet Take 10 mg by mouth every evening.   Yes Historical Provider, MD  propranolol (INDERAL) 10 MG tablet Take 10-30 mg by mouth daily as needed (for migraine pain).   Yes Historical Provider, MD  SUMAtriptan (IMITREX) 100 MG tablet Take 100 mg by mouth every 2 (two) hours as needed for migraine or headache. May repeat in 2 hours if headache persists or recurs.   Yes Historical Provider, MD  triamcinolone ointment (KENALOG) 0.1 % Apply 1 application topically 2 (two) times daily.   Yes Historical Provider, MD  Vitamin D, Ergocalciferol, (DRISDOL) 50000 UNITS CAPS capsule Take 50,000 Units by mouth every 7 (seven) days.   Yes Historical Provider, MD   Triage Vitals: BP 126/63  Pulse 87  Temp(Src) 99.3 F (37.4 C) (Oral)  Resp 27  SpO2 96%  Vital signs normal   Physical Exam  Nursing note and vitals reviewed. Constitutional: She is oriented to person, place, and time. She appears well-developed and well-nourished.  Non-toxic appearance. She does not appear ill. No distress.  HENT:  Head: Normocephalic and atraumatic.  Right Ear: External ear normal.  Left Ear: External ear normal.  Nose: Nose normal. No mucosal edema or rhinorrhea.  Mouth/Throat: Oropharynx is clear and moist and mucous membranes are normal. No dental abscesses or uvula swelling.  Eyes: Conjunctivae and EOM are normal. Pupils are equal, round, and reactive to light.  Neck: Normal range of motion and full passive range of motion without pain. Neck supple.  Cardiovascular: Normal rate, regular rhythm and normal heart sounds.  Exam reveals no gallop and no friction rub.   No murmur heard. Pulmonary/Chest: Effort normal. No respiratory distress. She has decreased breath sounds. She has no wheezes. She has no rhonchi. She has no rales. She exhibits no tenderness and no crepitus.  Abdominal: Soft. Normal appearance and bowel sounds are normal. She exhibits no distension. There is  tenderness (diffuse, lower). There is no rebound and no guarding.  Musculoskeletal: Normal range of motion. She exhibits no edema and no tenderness.  Moves all extremities well.   Neurological: She is alert and oriented to person, place, and time. She has normal strength. No cranial nerve deficit.  Skin: Skin is warm, dry and intact. No rash noted. No erythema. No pallor.  Psychiatric: She has a normal mood and affect. Her speech is normal and behavior is normal. Her mood appears not anxious.    ED Course  Procedures (including critical care time) Medications  0.9 %  sodium chloride infusion ( Intravenous New Bag/Given 08/21/13 1845)  sodium chloride 0.9 % bolus 1,000 mL (not administered)  potassium chloride SA (K-DUR,KLOR-CON) CR tablet 40 mEq (not administered)  ondansetron (ZOFRAN) injection 4 mg (4 mg Intravenous Given 08/21/13 1845)  ipratropium-albuterol (DUONEB) 0.5-2.5 (3)  MG/3ML nebulizer solution 3 mL (3 mLs Nebulization Given 08/21/13 1842)  albuterol (PROVENTIL) (2.5 MG/3ML) 0.083% nebulizer solution 2.5 mg (2.5 mg Nebulization Given 08/21/13 1842)   6:01 PM-Discussed treatment plan with pt at bedside and pt agreed to plan.   9:21 PM - Discussed labs with pt. Pt is feeling better. Discussed plan to give her extra fluid because of her new mild renal insufficiency and a potassium pill before discharge.    Imaging Review Dg Chest Portable 1 View  08/21/2013   CLINICAL DATA:  Shortness of breath. Upper abdominal pain. Hypertension.  EXAM: PORTABLE CHEST - 1 VIEW  COMPARISON:  07/04/2013  FINDINGS: Low lung volumes are demonstrated, however both lungs are clear. No evidence of pleural effusion. Heart size is stable allowing for portable technique and low lung volumes.  IMPRESSION: Low lung volumes.  No acute findings.   Electronically Signed   By: Earle Gell M.D.   On: 08/21/2013 18:28   Results for orders placed during the hospital encounter of 08/21/13  URINALYSIS, ROUTINE W REFLEX  MICROSCOPIC      Result Value Ref Range   Color, Urine YELLOW  YELLOW   APPearance CLEAR  CLEAR   Specific Gravity, Urine 1.010  1.005 - 1.030   pH 5.5  5.0 - 8.0   Glucose, UA NEGATIVE  NEGATIVE mg/dL   Hgb urine dipstick TRACE (*) NEGATIVE   Bilirubin Urine NEGATIVE  NEGATIVE   Ketones, ur NEGATIVE  NEGATIVE mg/dL   Protein, ur NEGATIVE  NEGATIVE mg/dL   Urobilinogen, UA 0.2  0.0 - 1.0 mg/dL   Nitrite NEGATIVE  NEGATIVE   Leukocytes, UA MODERATE (*) NEGATIVE  CBC WITH DIFFERENTIAL      Result Value Ref Range   WBC 9.7  4.0 - 10.5 K/uL   RBC 4.18  3.87 - 5.11 MIL/uL   Hemoglobin 11.3 (*) 12.0 - 15.0 g/dL   HCT 35.3 (*) 36.0 - 46.0 %   MCV 84.4  78.0 - 100.0 fL   MCH 27.0  26.0 - 34.0 pg   MCHC 32.0  30.0 - 36.0 g/dL   RDW 16.1 (*) 11.5 - 15.5 %   Platelets 287  150 - 400 K/uL   Neutrophils Relative % 55  43 - 77 %   Neutro Abs 5.3  1.7 - 7.7 K/uL   Lymphocytes Relative 38  12 - 46 %   Lymphs Abs 3.7  0.7 - 4.0 K/uL   Monocytes Relative 6  3 - 12 %   Monocytes Absolute 0.6  0.1 - 1.0 K/uL   Eosinophils Relative 1  0 - 5 %   Eosinophils Absolute 0.1  0.0 - 0.7 K/uL   Basophils Relative 0  0 - 1 %   Basophils Absolute 0.0  0.0 - 0.1 K/uL  COMPREHENSIVE METABOLIC PANEL      Result Value Ref Range   Sodium 144  137 - 147 mEq/L   Potassium 3.2 (*) 3.7 - 5.3 mEq/L   Chloride 103  96 - 112 mEq/L   CO2 29  19 - 32 mEq/L   Glucose, Bld 152 (*) 70 - 99 mg/dL   BUN 13  6 - 23 mg/dL   Creatinine, Ser 1.38 (*) 0.50 - 1.10 mg/dL   Calcium 9.1  8.4 - 10.5 mg/dL   Total Protein 7.0  6.0 - 8.3 g/dL   Albumin 3.6  3.5 - 5.2 g/dL   AST 19  0 - 37 U/L   ALT 22  0 - 35 U/L   Alkaline Phosphatase 67  39 - 117 U/L   Total Bilirubin 0.4  0.3 - 1.2 mg/dL   GFR calc non Af Amer 41 (*) >90 mL/min   GFR calc Af Amer 47 (*) >90 mL/min   Anion gap 12  5 - 15  PRO B NATRIURETIC PEPTIDE      Result Value Ref Range   Pro B Natriuretic peptide (BNP) 73.3  0 - 125 pg/mL  TROPONIN I      Result  Value Ref Range   Troponin I <0.30  <0.30 ng/mL  URINE MICROSCOPIC-ADD ON      Result Value Ref Range   Squamous Epithelial / LPF FEW (*) RARE   WBC, UA 11-20  <3 WBC/hpf   RBC / HPF 0-2  <3 RBC/hpf   Bacteria, UA FEW (*) RARE    Laboratory interpretation all normal except possible UTI, hypokalemia, new renal insufficiency that is mild, stable anemia    EKG Interpretation   Date/Time:  Tuesday August 21 2013 17:28:02 EDT Ventricular Rate:  88 PR Interval:  132 QRS Duration: 98 QT Interval:  384 QTC Calculation: 465 R Axis:   4 Text Interpretation:  Sinus rhythm Inferior infarct, old Otherwise within  normal limits Since last tracing 11 Dec 2012 REPOLARIZATION ABNORMALITY is  no longer Present Confirmed by Renell Coaxum  MD-I, Kersti Scavone ([id number]) on 08/21/2013  5:59:56 PM      MDM   Final diagnoses:  Bronchitis  Urinary tract infection without hematuria, site unspecified  Hypokalemia  Renal insufficiency   New Prescriptions   CEPHALEXIN (KEFLEX) 500 MG CAPSULE    Take 1 capsule (500 mg total) by mouth 3 (three) times daily.   ONDANSETRON (ZOFRAN) 4 MG TABLET    Take 1 tablet (4 mg total) by mouth every 8 (eight) hours as needed for nausea or vomiting.   POTASSIUM CHLORIDE SA (K-DUR,KLOR-CON) 20 MEQ TABLET    Take 1 tablet (20 mEq total) by mouth 2 (two) times daily.    Plan discharge  Rolland Porter, MD, FACEP   I personally performed the services described in this documentation, which was scribed in my presence. The recorded information has been reviewed and considered.  Rolland Porter, MD, Abram Sander    Emily Norrie, MD 08/21/13 [phone]

## 2013-08-21 NOTE — ED Notes (Signed)
Sob and nausea for 1 week,lower back pain

## 2013-08-25 LAB — URINE CULTURE: Colony Count: 80000

## 2013-08-26 ENCOUNTER — Telehealth (HOSPITAL_BASED_OUTPATIENT_CLINIC_OR_DEPARTMENT_OTHER): Payer: Self-pay | Admitting: Emergency Medicine

## 2013-08-26 NOTE — Telephone Encounter (Signed)
Post ED Visit - Positive Culture Follow-up  Culture report reviewed by antimicrobial stewardship pharmacist: []  Wes Dulaney, Pharm.D., BCPS []  Heide Guile, Pharm.D., BCPS []  Alycia Rossetti, Pharm.D., BCPS []  [place], Pharm.D., BCPS, AAHIVP [x]  Legrand Como, Pharm.D., BCPS, AAHIVP []  Hassie Bruce, Pharm.D. []  Milus Glazier, [place].D.  Positive urine culture Treated with Keflex, organism sensitive to the same and no further patient follow-up is required at this time.  [place], Rex Kras 08/26/2013, 1:06 PM

## 2014-05-22 ENCOUNTER — Emergency Department (HOSPITAL_COMMUNITY): Payer: Medicare Other

## 2014-05-22 ENCOUNTER — Emergency Department (HOSPITAL_COMMUNITY)
Admission: EM | Admit: 2014-05-22 | Discharge: 2014-05-22 | Disposition: A | Discharge status: Home / Self Care | Payer: Medicare Other | Attending: Emergency Medicine | Admitting: Emergency Medicine

## 2014-05-22 ENCOUNTER — Encounter (HOSPITAL_COMMUNITY): Payer: Self-pay | Admitting: Emergency Medicine

## 2014-05-22 DIAGNOSIS — Z792 Long term (current) use of antibiotics: Secondary | ICD-10-CM | POA: Diagnosis not present

## 2014-05-22 DIAGNOSIS — R51 Headache: Secondary | ICD-10-CM | POA: Insufficient documentation

## 2014-05-22 DIAGNOSIS — N390 Urinary tract infection, site not specified: Secondary | ICD-10-CM | POA: Insufficient documentation

## 2014-05-22 DIAGNOSIS — Z87442 Personal history of urinary calculi: Secondary | ICD-10-CM | POA: Diagnosis not present

## 2014-05-22 DIAGNOSIS — Z859 Personal history of malignant neoplasm, unspecified: Secondary | ICD-10-CM | POA: Insufficient documentation

## 2014-05-22 DIAGNOSIS — I10 Essential (primary) hypertension: Secondary | ICD-10-CM | POA: Diagnosis not present

## 2014-05-22 DIAGNOSIS — Z8701 Personal history of pneumonia (recurrent): Secondary | ICD-10-CM | POA: Diagnosis not present

## 2014-05-22 DIAGNOSIS — K5900 Constipation, unspecified: Secondary | ICD-10-CM | POA: Diagnosis not present

## 2014-05-22 DIAGNOSIS — R109 Unspecified abdominal pain: Secondary | ICD-10-CM | POA: Diagnosis present

## 2014-05-22 DIAGNOSIS — Z7952 Long term (current) use of systemic steroids: Secondary | ICD-10-CM | POA: Insufficient documentation

## 2014-05-22 DIAGNOSIS — F329 Major depressive disorder, single episode, unspecified: Secondary | ICD-10-CM | POA: Diagnosis not present

## 2014-05-22 DIAGNOSIS — Z8739 Personal history of other diseases of the musculoskeletal system and connective tissue: Secondary | ICD-10-CM | POA: Insufficient documentation

## 2014-05-22 DIAGNOSIS — E780 Pure hypercholesterolemia: Secondary | ICD-10-CM | POA: Diagnosis not present

## 2014-05-22 DIAGNOSIS — Z79899 Other long term (current) drug therapy: Secondary | ICD-10-CM | POA: Diagnosis not present

## 2014-05-22 DIAGNOSIS — K219 Gastro-esophageal reflux disease without esophagitis: Secondary | ICD-10-CM | POA: Insufficient documentation

## 2014-05-22 LAB — URINE MICROSCOPIC-ADD ON

## 2014-05-22 LAB — URINALYSIS, ROUTINE W REFLEX MICROSCOPIC
BILIRUBIN URINE: NEGATIVE
Glucose, UA: NEGATIVE mg/dL
Hgb urine dipstick: NEGATIVE
Ketones, ur: NEGATIVE mg/dL
Nitrite: NEGATIVE
PROTEIN: NEGATIVE mg/dL
Specific Gravity, Urine: >1.03 — ABNORMAL HIGH (ref 1.005–1.030)
UROBILINOGEN UA: 0.2 mg/dL (ref 0.0–1.0)
pH: 5.5 (ref 5.0–8.0)

## 2014-05-22 MED ORDER — CEPHALEXIN 500 MG PO CAPS
500.0000 mg | ORAL_CAPSULE | Freq: Once | ORAL | Status: AC
  Administered 2014-05-22: 500 mg via ORAL
  Filled 2014-05-22: qty 1

## 2014-05-22 MED ORDER — HYDROCODONE-ACETAMINOPHEN 5-325 MG PO TABS: 1.0000 | ORAL_TABLET | ORAL | Status: DC | PRN

## 2014-05-22 MED ORDER — HYDROCODONE-ACETAMINOPHEN 5-325 MG PO TABS
2.0000 | ORAL_TABLET | Freq: Once | ORAL | Status: AC
  Administered 2014-05-22: 2 via ORAL
  Filled 2014-05-22: qty 2

## 2014-05-22 MED ORDER — CELECOXIB 100 MG PO CAPS: 100.0000 mg | ORAL_CAPSULE | Freq: Two times a day (BID) | ORAL | Status: AC

## 2014-05-22 MED ORDER — MAGNESIUM HYDROXIDE 400 MG/5ML PO SUSP
15.0000 mL | Freq: Once | ORAL | Status: AC
  Administered 2014-05-22: 15 mL via ORAL
  Filled 2014-05-22: qty 30

## 2014-05-22 MED ORDER — CEPHALEXIN 500 MG PO CAPS: 500.0000 mg | ORAL_CAPSULE | Freq: Four times a day (QID) | ORAL | Status: DC

## 2014-05-22 MED ORDER — KETOROLAC TROMETHAMINE 10 MG PO TABS
10.0000 mg | ORAL_TABLET | Freq: Once | ORAL | Status: AC
Start: 2014-05-22 — End: 2014-05-22
  Administered 2014-05-22: 10 mg via ORAL
  Filled 2014-05-22: qty 1

## 2014-05-22 NOTE — ED Notes (Signed)
Patient ambulatory to restroom without difficulty. 

## 2014-05-22 NOTE — Discharge Instructions (Signed)
No emergency found on your CT scan. Your urine suggest you may be developing a urinary tract infection. There is noted some evidence of increased stool burden or and or constipation on your scan. There is also noted some areas that appeared to show some arthritis changes in your pelvis. Please discuss your pain with Dr. Morton Stall, and developed a pain management plan with him. Constipation Constipation is when a person:  Poops (has a bowel movement) less than 3 times a week.  Has a hard time pooping.  Has poop that is dry, hard, or bigger than normal. HOME CARE   Eat foods with a lot of fiber in them. This includes fruits, vegetables, beans, and whole grains such as brown rice.  Avoid fatty foods and foods with a lot of sugar. This includes french fries, hamburgers, cookies, candy, and soda.  If you are not getting enough fiber from food, take products with added fiber in them (supplements).  Drink enough fluid to keep your pee (urine) clear or pale yellow.  Exercise on a regular basis, or as told by your doctor.  Go to the restroom when you feel like you need to poop. Do not hold it.  Only take medicine as told by your doctor. Do not take medicines that help you poop (laxatives) without talking to your doctor first. GET HELP RIGHT AWAY IF:   You have bright red blood in your poop (stool).  Your constipation lasts more than 4 days or gets worse.  You have belly (abdominal) or butt (rectal) pain.  You have thin poop (as thin as a pencil).  You lose weight, and it cannot be explained. MAKE SURE YOU:   Understand these instructions.  Will watch your condition.  Will get help right away if you are not doing well or get worse. Document Released: 06/23/2007 Document Revised: 01/09/2013 Document Reviewed: 10/16/2012 [facility] Patient Information 2015 [place], [place]. This information is not intended to replace advice given to you by your health care provider. Make sure you discuss any  questions you have with your health care provider.  Flank Pain Flank pain is pain in your side. The flank is the area of your side between your upper belly (abdomen) and your back. Pain in this area can be caused by many different things. [place] care and treatment will depend on the cause of your pain.  Rest as told by your doctor.  Drink enough fluids to keep your pee (urine) clear or pale yellow.  Only take medicine as told by your doctor.  Tell your doctor about any changes in your pain.  Follow up with your doctor. GET HELP RIGHT AWAY IF:   Your pain does not get better with medicine.   You have new symptoms or your symptoms get worse.  Your pain gets worse.   You have belly (abdominal) pain.   You are short of breath.   You always feel sick to your stomach (nauseous).   You keep throwing up (vomiting).   You have puffiness (swelling) in your belly.   You feel light-headed or you pass out (faint).   You have blood in your pee.  You have a fever or lasting symptoms for more than 2-3 days.  You have a fever and your symptoms suddenly get worse. MAKE SURE YOU:   Understand these instructions.  Will watch your condition.  Will get help right away if you are not doing well or get worse. Document Released: 10/14/2007 Document Revised: 05/21/2013  Document Reviewed: 08/19/2011 [facility] Patient Information 2015 [place], [place]. This information is not intended to replace advice given to you by your health care provider. Make sure you discuss any questions you have with your health care provider.

## 2014-05-22 NOTE — ED Provider Notes (Signed)
CSN: [id number]     Arrival date & time 05/22/14  1909 History   First MD Initiated Contact with Patient 05/22/14 1926     Chief Complaint  Patient presents with  . Hip Pain     (Consider location/radiation/quality/duration/timing/severity/associated sxs/prior Treatment) HPI Comments: Patient is a 64 year old female who presents to the emergency department with a complaint of left flank pain. The patient states that for the past month she has been having problems with pain in the left flank. She states that the pain starts in the left lower abdomen area and radiates into the flank and then into the back. She states that the pain sometimes last all day. There is nothing that makes the pain any better. Certain movements make the pain even worse. She also notices that the pain is worse when she is exposed to weather changes. She denies any recent falls, injury, or trauma. She denies any blood in the urine. She denies any blood in the stools. She states her stools have recently been firm, but she does not feel as though she is constipated.  Patient is a 64 y.o. female presenting with hip pain. The history is provided by the patient.  Hip Pain This is a chronic problem. Associated symptoms include arthralgias and headaches.    Past Medical History  Diagnosis Date  . Hypertension   . Hypercholesteremia   . Depression   . Pneumonia 08/2012    hx of  . GERD (gastroesophageal reflux disease)   . History of kidney stones   . Headache(784.0)   . Arthritis   . Rheumatoid arthritis   . Cancer    Past Surgical History  Procedure Laterality Date  . Kidney stone surgery    . Cataract extraction Bilateral   . Esophageal dilation    . Robotic assited partial nephrectomy Left 12/18/2012    Procedure: ROBOTIC ASSITED PARTIAL NEPHRECTOMY AND  LEFT PYELOLITHOTOMY;  Surgeon: Dutch Gray, MD;  Location: [facility];  Service: Urology;  Laterality: Left;   No family history on file. History  Substance Use  Topics  . Smoking status: Never Smoker   . Smokeless tobacco: Never Used  . Alcohol Use: No   OB History    No data available     Review of Systems  Musculoskeletal: Positive for arthralgias.  Neurological: Positive for headaches.  Psychiatric/Behavioral:       Depression  All other systems reviewed and are negative.     Allergies  Naproxen  Home Medications   Prior to Admission medications   Medication Sig Start Date End Date Taking? Authorizing Provider  albuterol (PROVENTIL HFA;VENTOLIN HFA) 108 (90 BASE) MCG/ACT inhaler Inhale 1 puff into the lungs every 6 (six) hours as needed for wheezing or shortness of breath.    Historical Provider, MD  amLODipine (NORVASC) 10 MG tablet Take 10 mg by mouth daily.    Historical Provider, MD  cephALEXin (KEFLEX) 500 MG capsule Take 1 capsule (500 mg total) by mouth 3 (three) times daily. 08/21/13   Rolland Porter, MD  esomeprazole (NEXIUM) 40 MG capsule Take 40 mg by mouth 2 (two) times daily before a meal.    Historical Provider, MD  furosemide (LASIX) 20 MG tablet Take 20 mg by mouth daily as needed for fluid (Takes in the morning as needed for swelling).    Historical Provider, MD  HYDROcodone-acetaminophen (NORCO/VICODIN) 5-325 MG per tablet Take 1 tablet by mouth every 8 (eight) hours.    Historical Provider, MD  losartan (COZAAR)  100 MG tablet Take 100 mg by mouth daily.    Historical Provider, MD  montelukast (SINGULAIR) 10 MG tablet Take 10 mg by mouth every evening.    Historical Provider, MD  ondansetron (ZOFRAN) 4 MG tablet Take 1 tablet (4 mg total) by mouth every 8 (eight) hours as needed for nausea or vomiting. 08/21/13   Rolland Porter, MD  potassium chloride SA (K-DUR,KLOR-CON) 20 MEQ tablet Take 1 tablet (20 mEq total) by mouth 2 (two) times daily. 08/21/13   Rolland Porter, MD  pravastatin (PRAVACHOL) 80 MG tablet Take 80 mg by mouth daily.    Historical Provider, MD  predniSONE (DELTASONE) 10 MG tablet Take 10 mg by mouth every evening.     Historical Provider, MD  propranolol (INDERAL) 10 MG tablet Take 10-30 mg by mouth daily as needed (for migraine pain).    Historical Provider, MD  SUMAtriptan (IMITREX) 100 MG tablet Take 100 mg by mouth every 2 (two) hours as needed for migraine or headache. May repeat in 2 hours if headache persists or recurs.    Historical Provider, MD  triamcinolone ointment (KENALOG) 0.1 % Apply 1 application topically 2 (two) times daily.    Historical Provider, MD  Vitamin D, Ergocalciferol, (DRISDOL) 50000 UNITS CAPS capsule Take 50,000 Units by mouth every 7 (seven) days.    Historical Provider, MD   BP 136/68 mmHg  Pulse 79  Temp(Src) 98.9 F (37.2 C)  Resp 17  Ht 5\' 4"  (1.626 m)  Wt 200 lb (90.719 kg)  BMI 34.31 kg/m2  SpO2 97% Physical Exam  Constitutional: She is oriented to person, place, and time. She appears well-developed and well-nourished.  Non-toxic appearance.  HENT:  Head: Normocephalic.  Right Ear: Tympanic membrane and external ear normal.  Left Ear: Tympanic membrane and external ear normal.  Eyes: EOM and lids are normal. Pupils are equal, round, and reactive to light.  Neck: Normal range of motion. Neck supple. Carotid bruit is not present.  Cardiovascular: Normal rate, regular rhythm, normal heart sounds, intact distal pulses and normal pulses.   Pulmonary/Chest: Breath sounds normal. No respiratory distress.  Abdominal: Soft. Bowel sounds are normal. There is no hepatosplenomegaly. There is tenderness in the left lower quadrant. There is no rigidity, no guarding and no CVA tenderness.  Musculoskeletal:       Lumbar back: She exhibits decreased range of motion and tenderness.  Lymphadenopathy:       Head (right side): No submandibular adenopathy present.       Head (left side): No submandibular adenopathy present.    She has no cervical adenopathy.  Neurological: She is alert and oriented to person, place, and time. She has normal strength. No cranial nerve deficit or  sensory deficit.  Skin: Skin is warm and dry.  Psychiatric: She has a normal mood and affect. Her speech is normal.  Nursing note and vitals reviewed.   ED Course  Procedures (including critical care time) Labs Review Labs Reviewed  URINALYSIS, ROUTINE W REFLEX MICROSCOPIC    Imaging Review No results found.   EKG Interpretation None      MDM Vital signs are well within normal limits. Pulse oximetry is 97% on room air. Urinalysis shows a specific gravity be elevated at 1.030. There is a trace of leukocyte esterase, and 7-10 white blood cells per high-powered field, there is also a rare bacteria.  CT scan of the abdomen and pelvis was obtained due to history of kidney stones, and left flank area pain.  The CT scan is negative for urine tract stones, there no acute findings of the abdomen or pelvis. There is noted some aortoiliac atherosclerosis, but no aneurysm is noted. There is increased stool burden noted.  The patient will be treated with milk of magnesia, Celebrex, and Norco. The patient is strongly encouraged to see her primary physician and have a discussion concerning the ongoing pain issues that she is having.    Final diagnoses:  None    *I have reviewed nursing notes, vital signs, and all appropriate lab and imaging results for this patient.    Lily Kocher, PA-C 05/22/14 2158  Virgel Manifold, MD 05/25/14 [phone]

## 2014-05-22 NOTE — ED Notes (Signed)
Patient c/o left flank pain x 1 month.  States has seen her MD for the same and was told she has arthritis.

## 2014-05-25 LAB — URINE CULTURE

## 2014-05-27 ENCOUNTER — Telehealth (HOSPITAL_COMMUNITY): Payer: Self-pay

## 2014-05-27 NOTE — Telephone Encounter (Signed)
Post ED Visit - Positive Culture Follow-up  Culture report reviewed by antimicrobial stewardship pharmacist: []  Wes [place], Pharm.D., BCPS []  Heide Guile, Pharm.D., BCPS []  Alycia Rossetti, Pharm.D., BCPS []  [place], [place].D., BCPS, AAHIVP []  Legrand Como, Pharm.D., BCPS, AAHIVP []  Isac Sarna, Pharm.D., BCPS X  Sherlon Handing, Pharm D  Positive Urine culture, 75,000 colonies -> Klebsiella Pneumoniae Treated with Cephalexin, organism sensitive to the same and no further patient follow-up is required at this time.  Jamily[place], [place] 05/27/2014, 8:34 PM

## 2016-01-22 IMAGING — CT CT RENAL STONE PROTOCOL
2 of 4 series · 17 of 46 positions shown, 19 images · non-contrast
Comparison: CT abdomen and pelvis 07/24/2013.

CLINICAL DATA: Left flank pain for 1 month.

EXAM:
CT ABDOMEN AND PELVIS WITHOUT CONTRAST
TECHNIQUE: Multidetector CT imaging of the abdomen and pelvis was performed
following the standard protocol without IV contrast.

[Series 2: standard/full over (age)lbs 5.0 · axial · 0.78mm/px · z∈[-534,-189]mm · 14 of 77 slices shown, 16 images]
[im 4/77  soft-tissue]
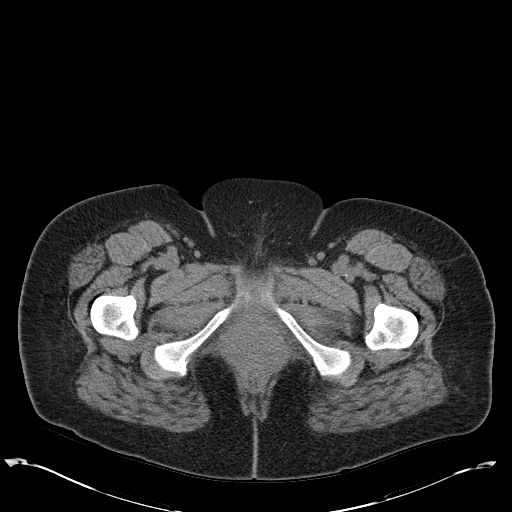
[im 4/77  bone]
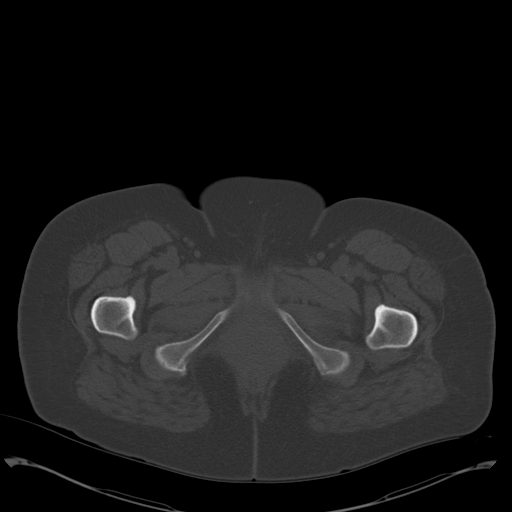
[im 10/77  soft-tissue]
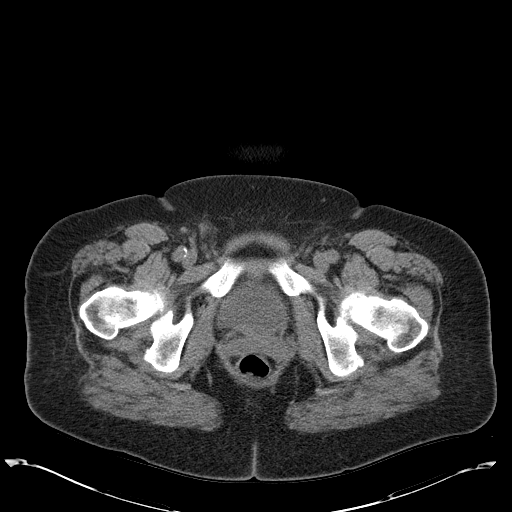
[im 16/77  soft-tissue]
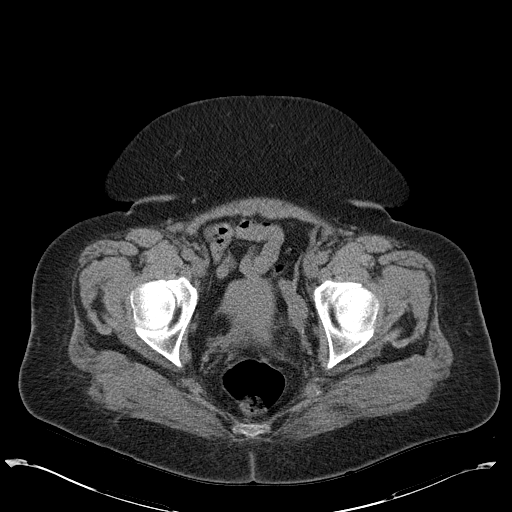
[im 22/77  soft-tissue]
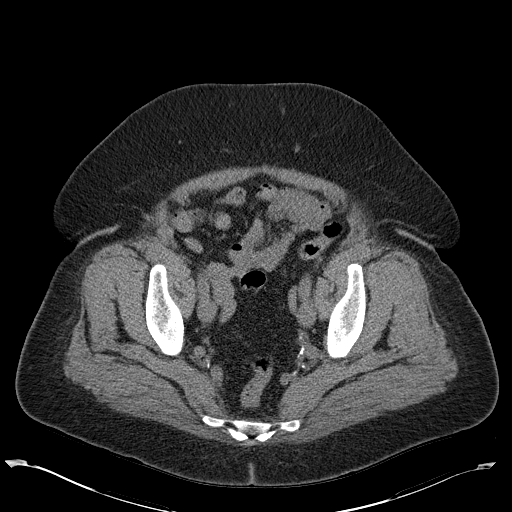
[im 25/77  soft-tissue]
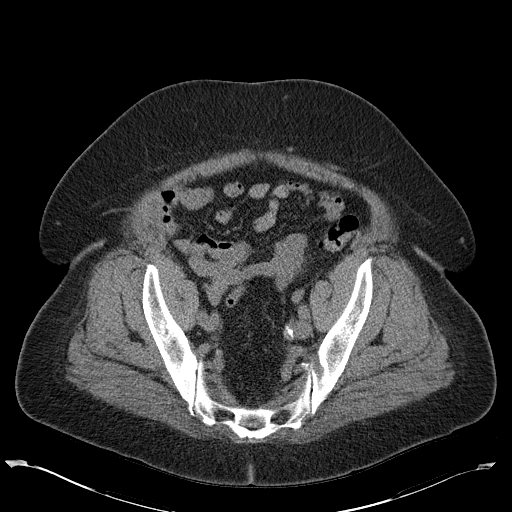
[im 31/77  soft-tissue]
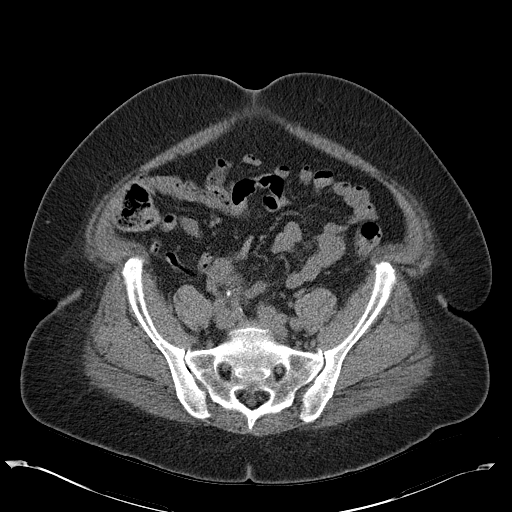
[im 37/77  soft-tissue]
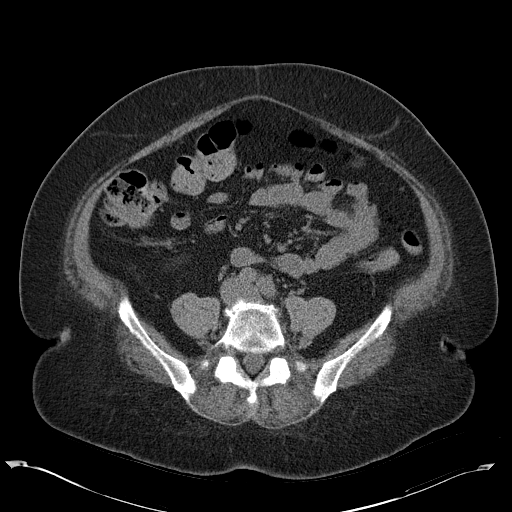
[im 40/77  soft-tissue]
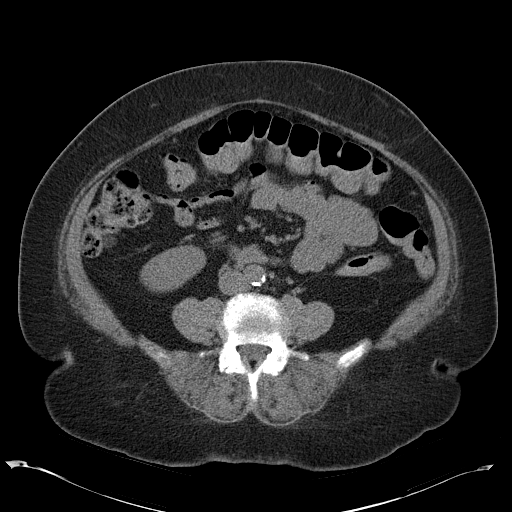
[im 46/77  soft-tissue]
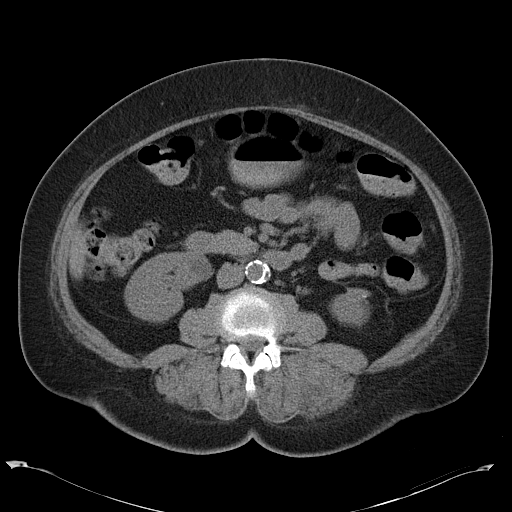
[im 46/77  bone]
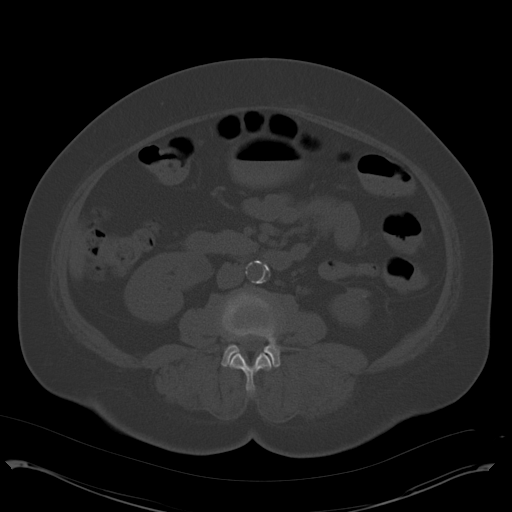
[im 52/77  soft-tissue]
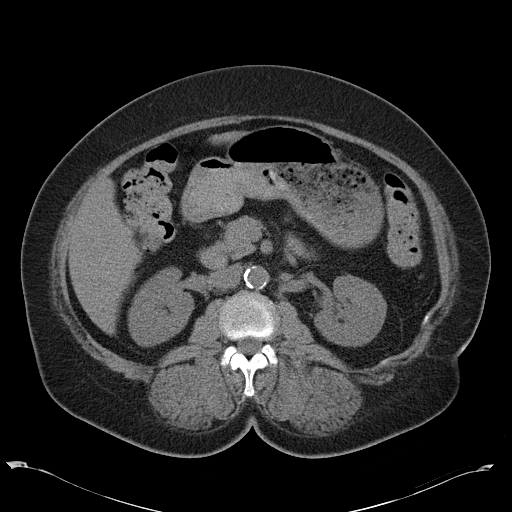
[im 58/77  soft-tissue]
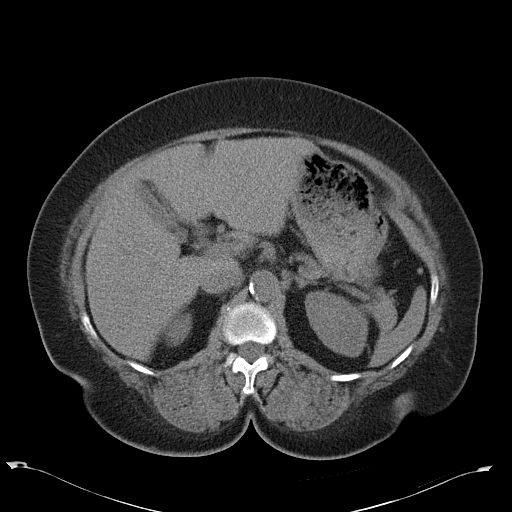
[im 61/77  soft-tissue]
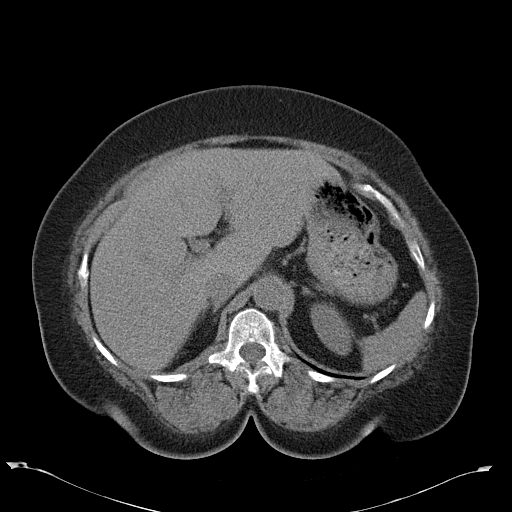
[im 67/77  soft-tissue]
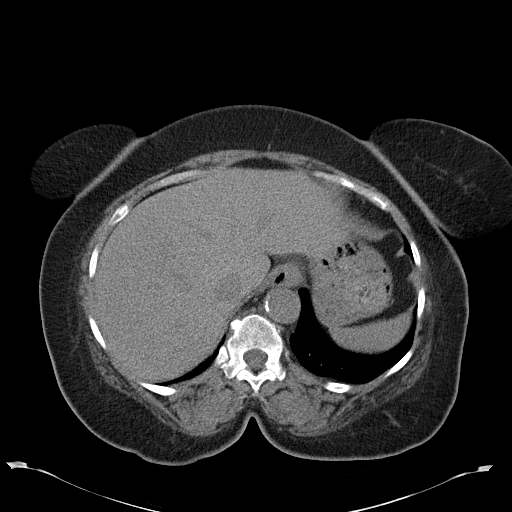
[im 73/77  soft-tissue]
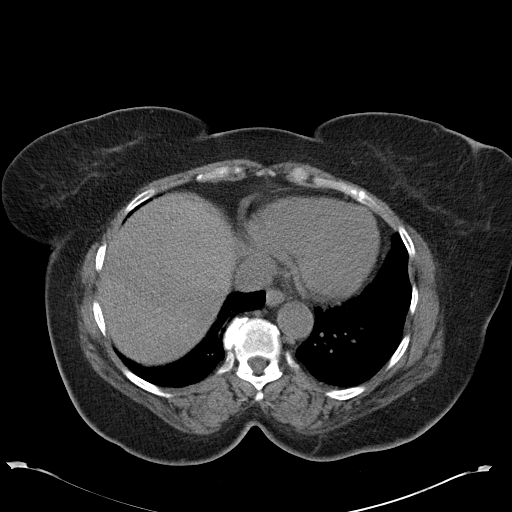

[Series 4: mpr coronal · coronal · 0.77mm/px · 3 of 103 slices shown]
[im 35/103  soft-tissue]
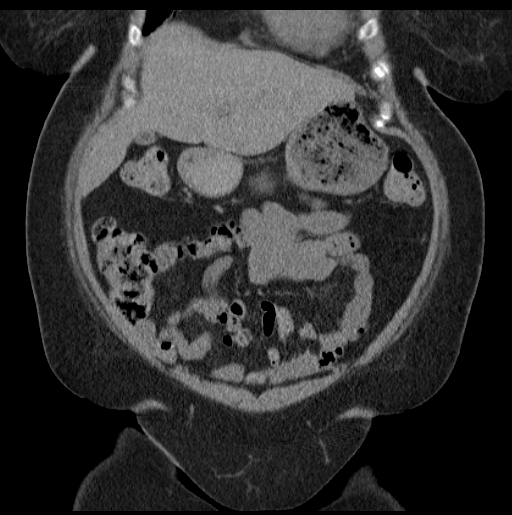
[im 46/103  soft-tissue]
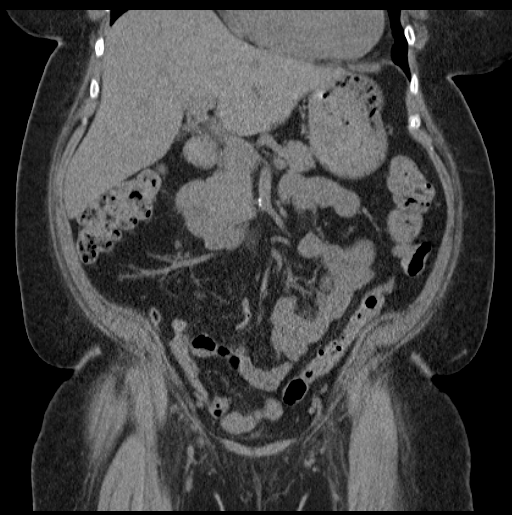
[im 57/103  soft-tissue]
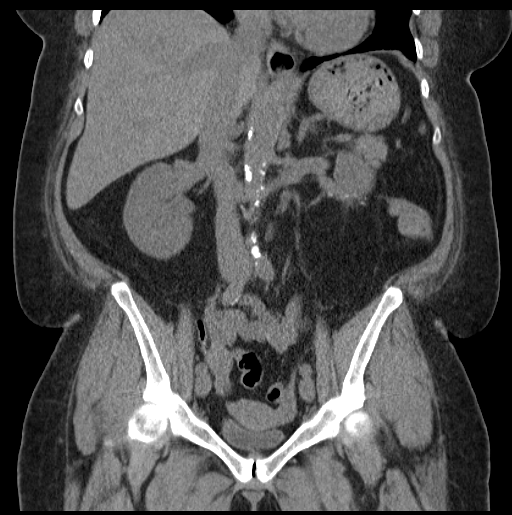

[17 of 46 positions shown; findings below may reference images not displayed]

FINDINGS: Mild dependent atelectasis is seen in the lung bases. No pleural or
pericardial effusion.

The dome of the liver is off the superior margin of the scan. Imaged
liver parenchyma appears normal. The spleen, adrenal glands,
pancreas and right kidney are unremarkable. Defect of the lower pole
of the left kidney is consistent with partial nephrectomy. No renal
or ureteral stones are identified on the right or left. Small cyst
in the lower pole of the right kidney is unchanged.

Uterus, adnexa and urinary bladder are unremarkable. Laxity of the
anterior abdominal wall is unchanged appearance The stomach, small
and large bowel and appendix appear normal. Retroaortic left renal
vein is noted. Aortoiliac atherosclerosis without aneurysm is seen.
No lymphadenopathy or fluid is identified. No lytic or sclerotic
bony lesion is seen.
IMPRESSION: Negative for urinary tract stone. No acute finding abdomen or
pelvis.

Status post partial left nephrectomy.

Aortoiliac atherosclerosis without aneurysm.

## 2017-05-18 HISTORY — PX: CORONARY ANGIOPLASTY WITH STENT PLACEMENT: SHX49

## 2019-12-26 ENCOUNTER — Emergency Department (HOSPITAL_COMMUNITY): Payer: Medicare Other

## 2019-12-26 ENCOUNTER — Other Ambulatory Visit: Payer: Self-pay

## 2019-12-26 ENCOUNTER — Encounter (HOSPITAL_COMMUNITY): Payer: Self-pay | Admitting: Emergency Medicine

## 2019-12-26 ENCOUNTER — Emergency Department (HOSPITAL_COMMUNITY)
Admission: EM | Admit: 2019-12-26 | Discharge: 2019-12-26 | Disposition: A | Discharge status: Home / Self Care | Payer: Medicare Other | Attending: Emergency Medicine | Admitting: Emergency Medicine

## 2019-12-26 DIAGNOSIS — Z85828 Personal history of other malignant neoplasm of skin: Secondary | ICD-10-CM | POA: Diagnosis not present

## 2019-12-26 DIAGNOSIS — I10 Essential (primary) hypertension: Secondary | ICD-10-CM | POA: Diagnosis not present

## 2019-12-26 DIAGNOSIS — M25512 Pain in left shoulder: Secondary | ICD-10-CM | POA: Insufficient documentation

## 2019-12-26 DIAGNOSIS — M546 Pain in thoracic spine: Secondary | ICD-10-CM | POA: Diagnosis not present

## 2019-12-26 DIAGNOSIS — M791 Myalgia, unspecified site: Secondary | ICD-10-CM

## 2019-12-26 DIAGNOSIS — Z79899 Other long term (current) drug therapy: Secondary | ICD-10-CM | POA: Diagnosis not present

## 2019-12-26 DIAGNOSIS — Z955 Presence of coronary angioplasty implant and graft: Secondary | ICD-10-CM | POA: Insufficient documentation

## 2019-12-26 MED ORDER — METHOCARBAMOL 500 MG PO TABS: 500.0000 mg | ORAL_TABLET | Freq: Three times a day (TID) | ORAL | 0 refills | Status: AC

## 2019-12-26 MED ORDER — LIDOCAINE 5 % EX PTCH: 1.0000 | MEDICATED_PATCH | CUTANEOUS | 0 refills | Status: AC

## 2019-12-26 MED ORDER — HYDROCODONE-ACETAMINOPHEN 5-325 MG PO TABS
1.0000 | ORAL_TABLET | Freq: Once | ORAL | Status: AC
  Administered 2019-12-26: 1 via ORAL
  Filled 2019-12-26: qty 1

## 2019-12-26 NOTE — ED Notes (Signed)
Patient transported to X-ray 

## 2019-12-26 NOTE — Discharge Instructions (Addendum)
Your pain is likely related to muscular injury of your shoulder and upper back.  You may try alternating ice and heat to your shoulder.  Contact your primary care provider to arrange follow-up appointment.

## 2019-12-26 NOTE — ED Provider Notes (Signed)
[facility] EMERGENCY DEPARTMENT Provider Note   CSN: [id number] Arrival date & time: 12/26/19  1814     History Chief Complaint  Patient presents with  . Shoulder Pain    Emily Cooper is a 69 y.o. female.  HPI      Emily Cooper is a 69 y.o. female who presents to the Emergency Department complaining of pain to her left shoulder and left upper back for 1 month.  She describes an aching pain along the top of her shoulder radiating around her shoulder blade that worsens with movement.  No known injury or recent illness.  She describes having intermittent pain that radiates into her left upper arm.  Pain has been gradually worsening and keeping her from performing her daily activities.  She has been seen by her primary care provider in [place] for her symptoms and awaiting follow-up.  States that she is here for pain relief.  She has tried multiple over-the-counter pain relievers and creams without relief.  She denies any chest pain, shortness of breath, neck pain or stiffness, headaches, visual changes or dizziness.  No numbness or weakness of her upper extremities.    Past Medical History:  Diagnosis Date  . Arthritis   . Cancer ([place])    kidney  . Depression   . GERD (gastroesophageal reflux disease)   . Headache(784.0)   . History of kidney stones   . Hypercholesteremia   . Hypertension   . Pneumonia 08/2012   hx of  . Rheumatoid arthritis [facility])     Patient Active Problem List   Diagnosis Date Noted  . Renal neoplasm 12/18/2012    Past Surgical History:  Procedure Laterality Date  . CATARACT EXTRACTION Bilateral   . CORONARY ANGIOPLASTY WITH STENT PLACEMENT  05/2017  . ESOPHAGEAL DILATION    . KIDNEY STONE SURGERY    . ROBOTIC ASSITED PARTIAL NEPHRECTOMY Left 12/18/2012   Procedure: ROBOTIC ASSITED PARTIAL NEPHRECTOMY AND  LEFT PYELOLITHOTOMY;  Surgeon: Dutch Gray, MD;  Location: [facility];  Service: Urology;  Laterality: Left;     OB History   No  obstetric history on file.     History reviewed. No pertinent family history.  Social History   Tobacco Use  . Smoking status: Never Smoker  . Smokeless tobacco: Never Used  Vaping Use  . Vaping Use: Never used  Substance Use Topics  . Alcohol use: No  . Drug use: No    Home Medications Prior to Admission medications   Medication Sig Start Date End Date Taking? Authorizing Provider  albuterol (PROVENTIL HFA;VENTOLIN HFA) 108 (90 BASE) MCG/ACT inhaler Inhale 1 puff into the lungs every 6 (six) hours as needed for wheezing or shortness of breath.    [provider]  amLODipine (NORVASC) 10 MG tablet Take 10 mg by mouth daily.    [provider]  celecoxib (CELEBREX) 100 MG capsule Take 1 capsule (100 mg total) by mouth 2 (two) times daily. 05/22/14   Lily Kocher, PA-C  cephALEXin (KEFLEX) 500 MG capsule Take 1 capsule (500 mg total) by mouth 4 (four) times daily. 05/22/14   Lily Kocher, PA-C  esomeprazole (NEXIUM) 40 MG capsule Take 40 mg by mouth 2 (two) times daily before a meal.    [provider]  furosemide (LASIX) 20 MG tablet Take 20 mg by mouth daily as needed for fluid (Takes in the morning as needed for swelling).    [provider]  HYDROcodone-acetaminophen (NORCO/VICODIN) 5-325 MG per tablet Take 1  tablet by mouth every 4 (four) hours as needed. 05/22/14   Lily Kocher, PA-C  losartan (COZAAR) 100 MG tablet Take 100 mg by mouth daily.    [provider]  montelukast (SINGULAIR) 10 MG tablet Take 10 mg by mouth every evening.    [provider]  ondansetron (ZOFRAN) 4 MG tablet Take 1 tablet (4 mg total) by mouth every 8 (eight) hours as needed for nausea or vomiting. 08/21/13   Rolland Porter, MD  potassium chloride SA (K-DUR,KLOR-CON) 20 MEQ tablet Take 1 tablet (20 mEq total) by mouth 2 (two) times daily. 08/21/13   Rolland Porter, MD  pravastatin (PRAVACHOL) 80 MG tablet Take 80 mg by mouth daily.    [provider]   predniSONE (DELTASONE) 10 MG tablet Take 10 mg by mouth every evening.    [provider]  propranolol (INDERAL) 10 MG tablet Take 10-30 mg by mouth daily as needed (for migraine pain).    [provider]  SUMAtriptan (IMITREX) 100 MG tablet Take 100 mg by mouth every 2 (two) hours as needed for migraine or headache. May repeat in 2 hours if headache persists or recurs.    [provider]  triamcinolone ointment (KENALOG) 0.1 % Apply 1 application topically 2 (two) times daily.    [provider]  Vitamin D, Ergocalciferol, (DRISDOL) 50000 UNITS CAPS capsule Take 50,000 Units by mouth every 7 (seven) days.    [provider]    Allergies    Naproxen  Review of Systems   Review of Systems  Constitutional: Negative for chills and fever.  Eyes: Negative for visual disturbance.  Respiratory: Negative for cough, chest tightness and shortness of breath.   Cardiovascular: Negative for chest pain and leg swelling.  Gastrointestinal: Negative for abdominal pain, nausea and vomiting.  Musculoskeletal: Positive for arthralgias (Left shoulder and left upper back pain). Negative for joint swelling and neck pain.  Skin: Negative for color change and wound.  Neurological: Negative for dizziness, syncope, weakness, numbness and headaches.    Physical Exam Updated Vital Signs BP 122/75 (BP Location: Left Wrist)   Pulse (!) 58   Temp 98.4 F (36.9 C) (Oral)   Resp 18   Ht 5\' 2"  (1.575 m)   Wt 87.1 kg   SpO2 100%   BMI 35.12 kg/m   Physical Exam Vitals and nursing note reviewed.  Constitutional:      Appearance: Normal appearance. She is not ill-appearing.  HENT:     Head: Atraumatic.  Neck:     Trachea: Phonation normal.     Comments: No midline cervical tenderness or paraspinal tenderness Cardiovascular:     Rate and Rhythm: Normal rate and regular rhythm.     Pulses: Normal pulses.  Pulmonary:     Effort: Pulmonary effort is normal.      Breath sounds: Normal breath sounds.  Chest:     Chest wall: No tenderness.  Abdominal:     General: There is no distension.     Palpations: Abdomen is soft.     Tenderness: There is no abdominal tenderness.  Musculoskeletal:        General: Tenderness present. No swelling or signs of injury.       Arms:     Cervical back: Full passive range of motion without pain and normal range of motion. No tenderness. No spinous process tenderness or muscular tenderness. Normal range of motion.     Right lower leg: No edema.  Left lower leg: No edema.     Comments: Tenderness to palpation along the left trapezius and rhomboid muscles.  Pain on range of motion of the left shoulder joint.  No crepitus, distal clavicle nontender.  No midline spinal tenderness.  Skin:    General: Skin is warm.     Capillary Refill: Capillary refill takes less than 2 seconds.     Findings: No rash.  Neurological:     General: No focal deficit present.     Mental Status: She is alert.     Sensory: Sensation is intact. No sensory deficit.     Motor: Motor function is intact. No weakness.     Comments: CN III through XII intact.  Speech clear.  No pronator drift.  Grip strengths are strong and symmetrical bilaterally.     ED Results / Procedures / Treatments   Labs (all labs ordered are listed, but only abnormal results are displayed) Labs Reviewed - No data to display  EKG None  Radiology No results found.  Procedures Procedures (including critical care time)  Medications Ordered in ED Medications  HYDROcodone-acetaminophen (NORCO/VICODIN) 5-325 MG per tablet 1 tablet (has no administration in time range)    ED Course  I have reviewed the triage vital signs and the nursing notes.  Pertinent labs & imaging results that were available during my care of the patient were reviewed by me and considered in my medical decision making (see chart for details).    MDM Rules/Calculators/A&P                            Patient here with 1 month history of left shoulder and left upper back pain.  No known injury.  No cervical tenderness or radicular symptoms.  Neurovascularly intact.  No focal neuro deficits.  Pain is reproducible to palpation along the anterior shoulder joint, trapezius and rhomboid muscles.  No chest pain or dyspnea, vitals reassuring.  No history of injury to suggest need for imaging.    Patient with likely musculoskeletal pain.  No concerning symptoms for emergent neurological or cardiac process.  I feel that patient is appropriate for discharge home and close outpatient follow-up.  Patient reviewed on narcotic database, received opioid prescription in November for 30 day supply.  I do not feel that additional narcotic are warranted.      Final Clinical Impression(s) / ED Diagnoses Final diagnoses:  Muscular pain    Rx / DC Orders ED Discharge Orders    None       Bufford Lope 09/81/19 1478    Delora Fuel, MD 29/56/21 [phone]

## 2019-12-26 NOTE — ED Triage Notes (Signed)
Pt c/o left shoulder and back pain for the past month.

## 2021-08-27 IMAGING — DX DG SHOULDER 2+V*L*
3 series · 3 of 3 positions shown · non-contrast
Comparison: None.

CLINICAL DATA: Shoulder and back pain

EXAM:
LEFT SHOULDER - 2+ VIEW

[shoulder grashey]
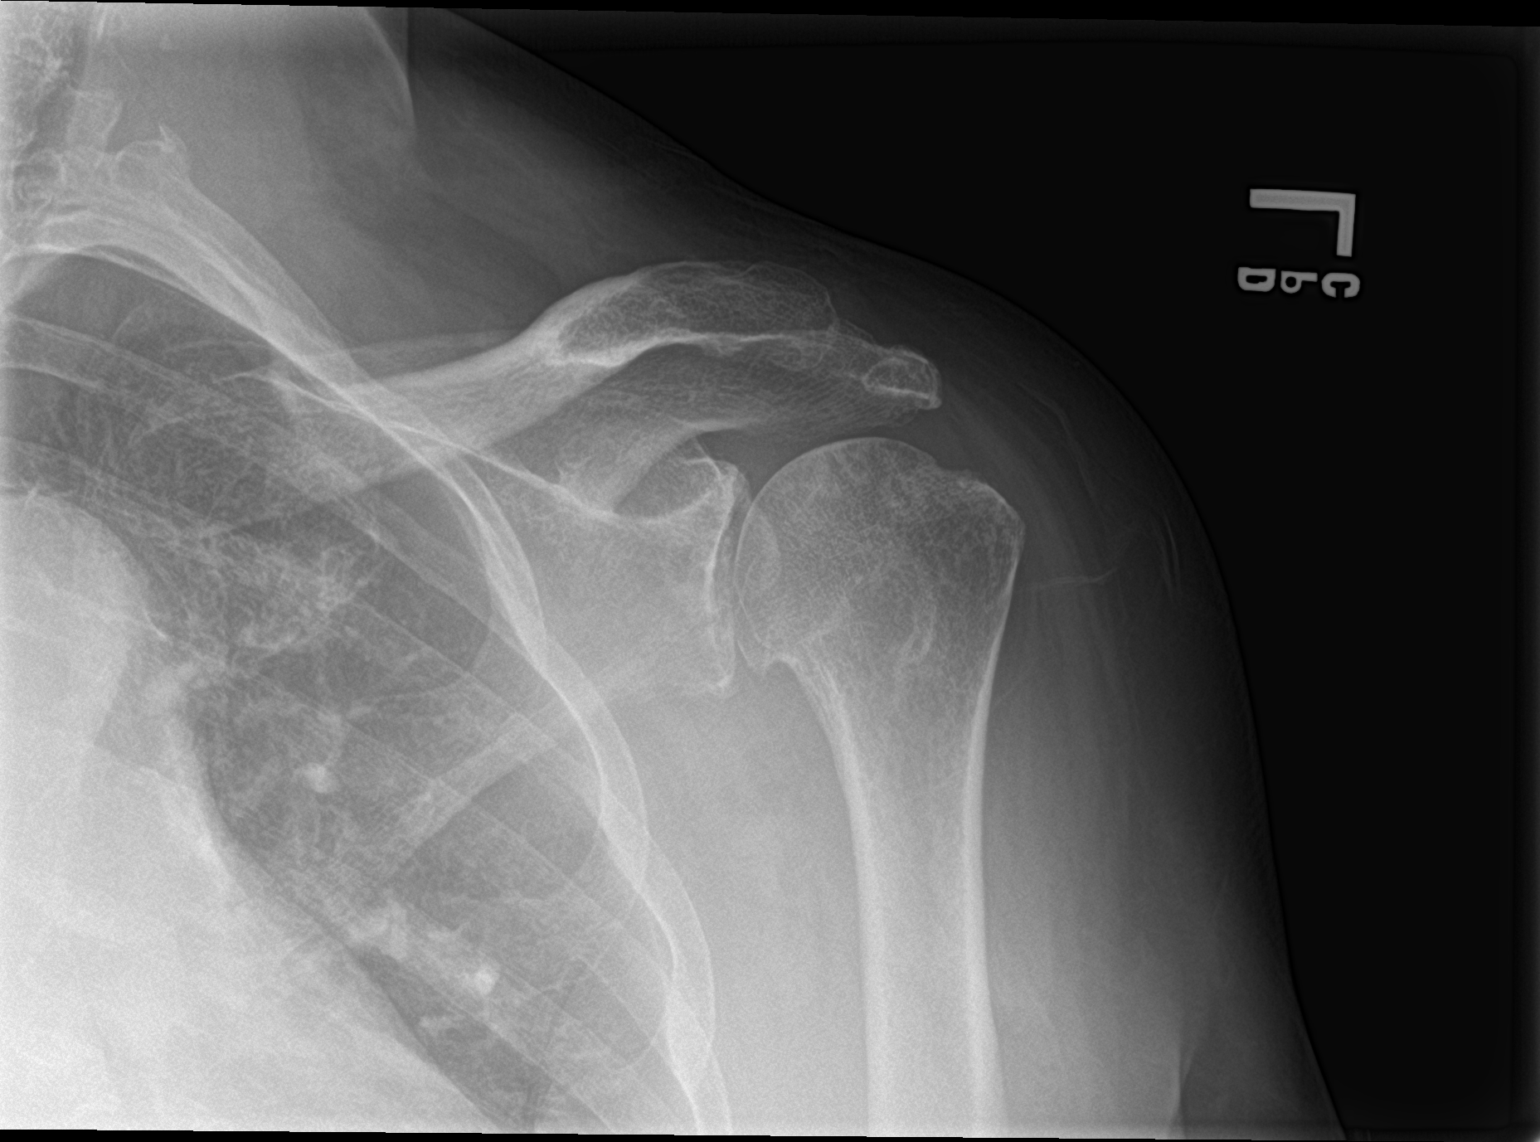

[shoulder y view]
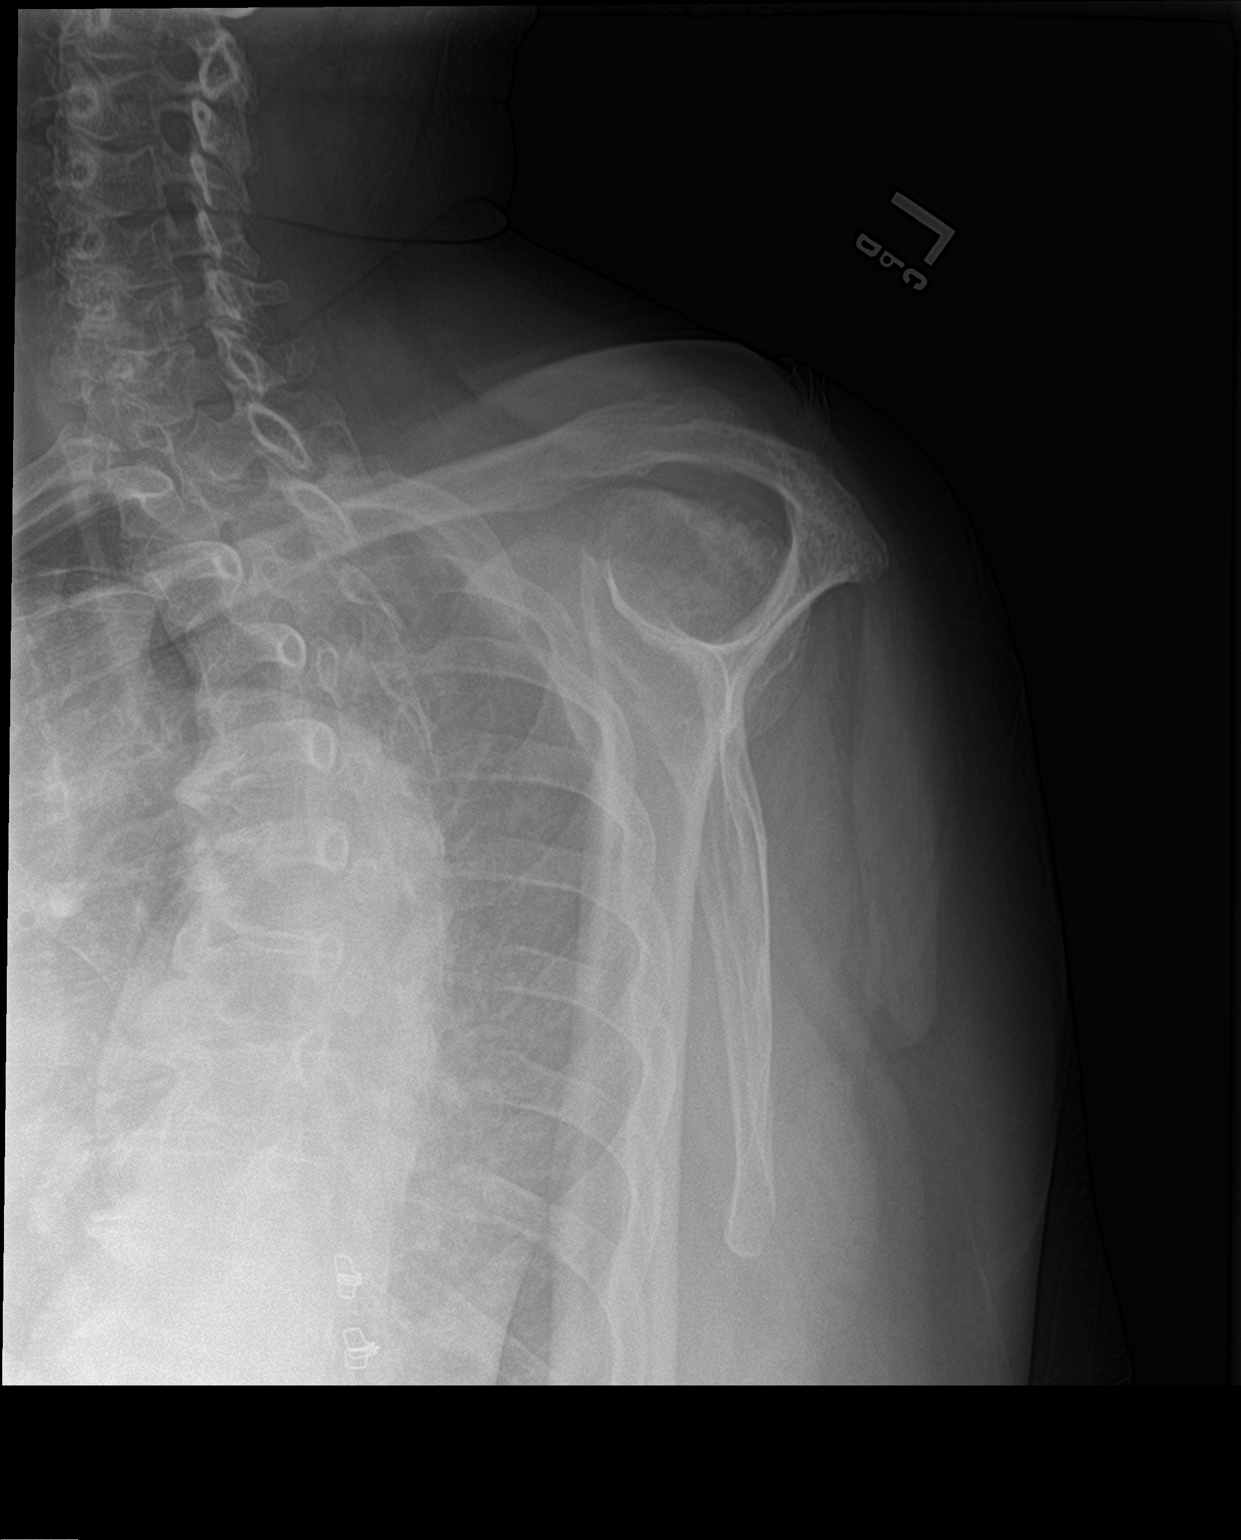

[shoulder axillary]
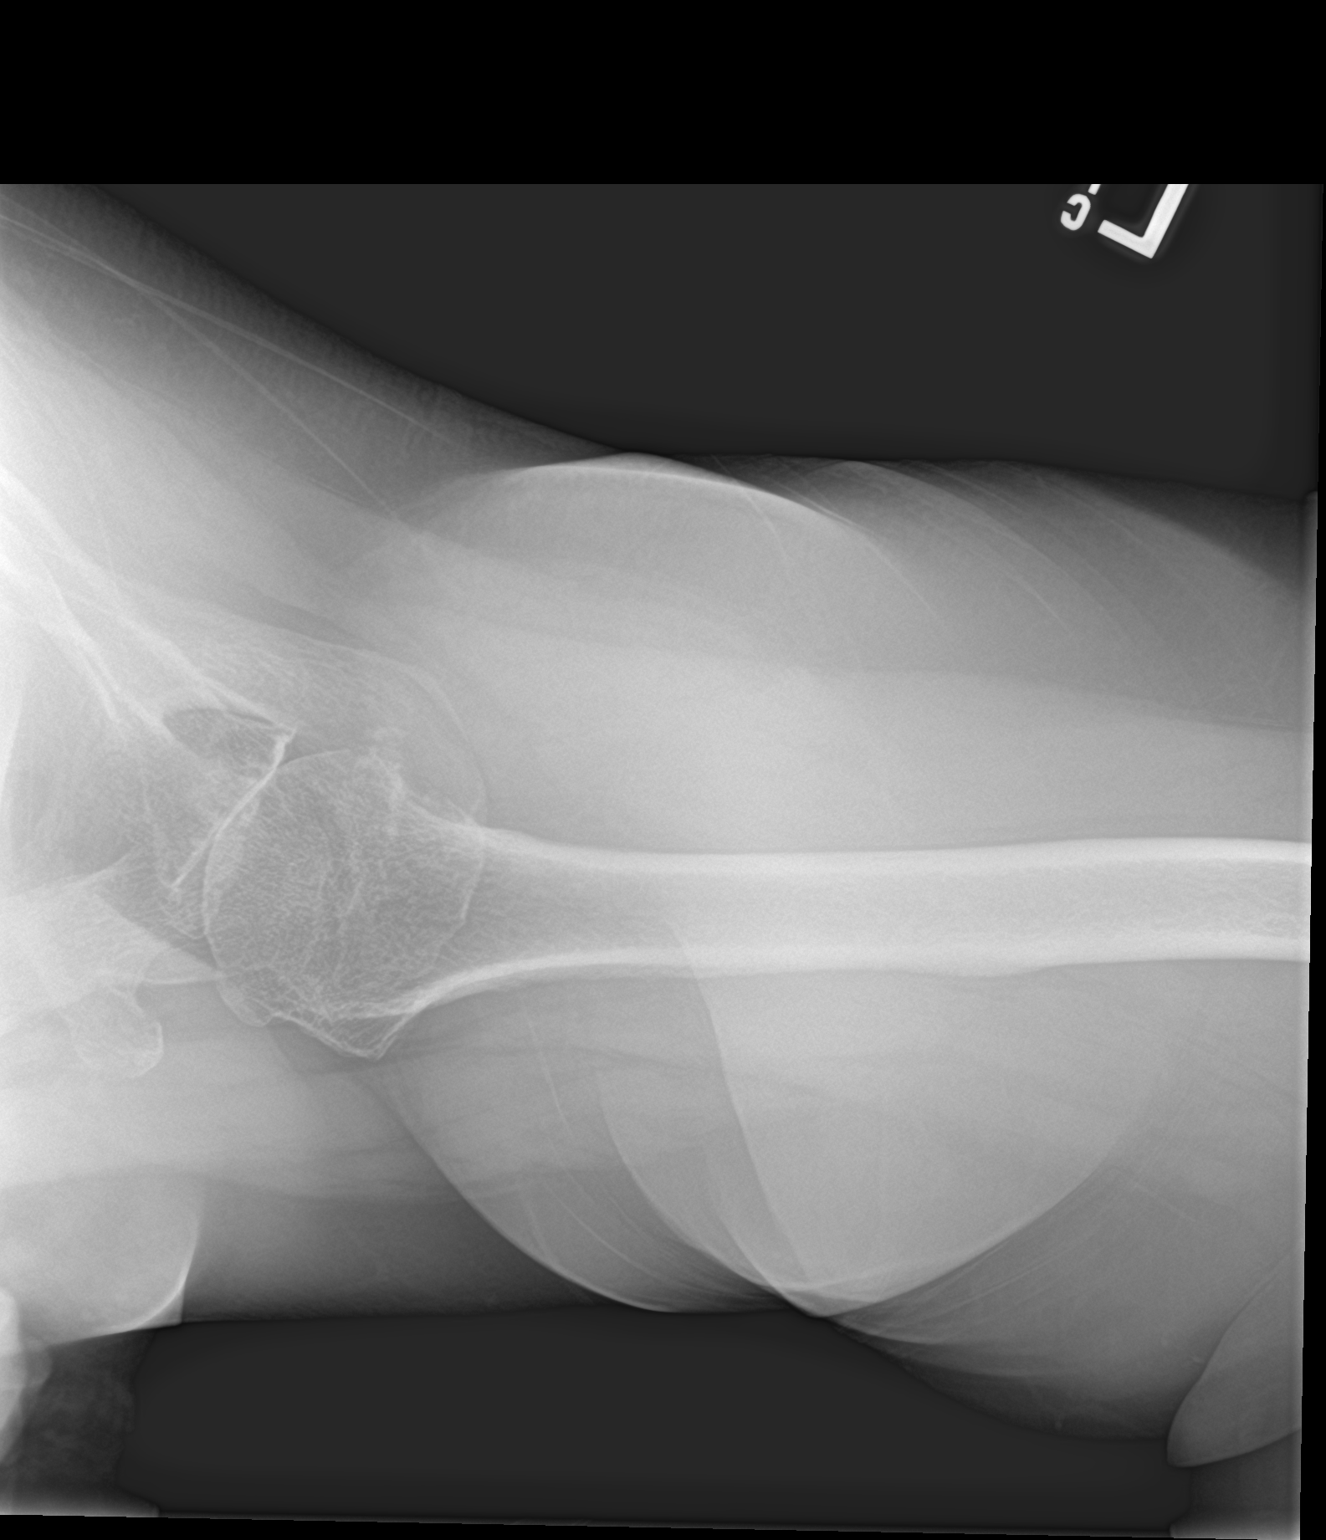

[3 of 3 positions shown; findings below may reference images not displayed]

FINDINGS: There is no evidence of fracture or dislocation. Moderate
glenohumeral joint osteoarthritis is seen with joint space loss and
marginal osteophyte formation. There appears to be slight widening
of the AC joint measuring up to 7 mm with mild overlying soft tissue
swelling.
IMPRESSION: No acute slight widening of the AC joint which could be due to grade
1 AC joint injury.
